# Patient Record
Sex: Male | Born: 1960 | Race: White | Hispanic: No | Marital: Married | Smoking: Current every day smoker
Health system: Southern US, Community
[De-identification: ages and names within clinical notes are randomized; demographics above are authoritative.]

## PROBLEM LIST (undated history)

## (undated) DIAGNOSIS — J449 Chronic obstructive pulmonary disease, unspecified: Secondary | ICD-10-CM

## (undated) DIAGNOSIS — I10 Essential (primary) hypertension: Secondary | ICD-10-CM

## (undated) HISTORY — DX: Chronic obstructive pulmonary disease, unspecified: J44.9

## (undated) HISTORY — PX: KNEE SURGERY: SHX244

---

## 1999-06-19 ENCOUNTER — Encounter: Payer: Self-pay | Admitting: Orthopedic Surgery

## 1999-06-19 ENCOUNTER — Ambulatory Visit (HOSPITAL_COMMUNITY): Admission: RE | Admit: 1999-06-19 | Discharge: 1999-06-19 | Payer: Self-pay | Admitting: Orthopedic Surgery

## 1999-08-28 ENCOUNTER — Emergency Department (HOSPITAL_COMMUNITY): Admission: EM | Admit: 1999-08-28 | Discharge: 1999-08-28 | Payer: Self-pay | Admitting: Emergency Medicine

## 1999-08-28 ENCOUNTER — Encounter: Payer: Self-pay | Admitting: Emergency Medicine

## 2004-05-02 ENCOUNTER — Ambulatory Visit (HOSPITAL_COMMUNITY): Admission: RE | Admit: 2004-05-02 | Discharge: 2004-05-02 | Payer: Self-pay | Admitting: Emergency Medicine

## 2006-07-07 ENCOUNTER — Ambulatory Visit: Payer: Self-pay | Admitting: Family Medicine

## 2010-11-05 ENCOUNTER — Ambulatory Visit
Admission: RE | Admit: 2010-11-05 | Discharge: 2010-11-05 | Disposition: A | Discharge status: Home / Self Care | Payer: BC Managed Care – PPO | Source: Ambulatory Visit | Attending: Orthopedic Surgery | Admitting: Orthopedic Surgery

## 2010-11-05 ENCOUNTER — Other Ambulatory Visit: Payer: Self-pay | Admitting: Orthopedic Surgery

## 2010-11-05 DIAGNOSIS — R52 Pain, unspecified: Secondary | ICD-10-CM

## 2011-10-28 ENCOUNTER — Ambulatory Visit (INDEPENDENT_AMBULATORY_CARE_PROVIDER_SITE_OTHER): Payer: BC Managed Care – PPO | Admitting: Family Medicine

## 2011-10-28 DIAGNOSIS — M109 Gout, unspecified: Secondary | ICD-10-CM

## 2011-10-28 DIAGNOSIS — M79609 Pain in unspecified limb: Secondary | ICD-10-CM

## 2011-10-28 DIAGNOSIS — M79672 Pain in left foot: Secondary | ICD-10-CM

## 2011-10-28 DIAGNOSIS — F1010 Alcohol abuse, uncomplicated: Secondary | ICD-10-CM

## 2011-10-28 MED ORDER — METHYLPREDNISOLONE ACETATE 40 MG/ML IJ SUSP
80.0000 mg | Freq: Once | INTRAMUSCULAR | Status: AC
  Administered 2011-10-28: 80 mg via INTRAMUSCULAR

## 2011-10-28 MED ORDER — COLCHICINE 0.6 MG PO TABS: ORAL_TABLET | ORAL | Status: DC

## 2011-10-28 MED ORDER — ALPRAZOLAM 0.5 MG PO TABS: ORAL_TABLET | ORAL | Status: DC

## 2011-10-28 MED ORDER — HYDROCODONE-ACETAMINOPHEN 10-325 MG PO TABS: 1.0000 | ORAL_TABLET | Freq: Three times a day (TID) | ORAL | Status: AC | PRN

## 2011-10-28 NOTE — Progress Notes (Signed)
  Subjective:    Patient ID: Blake Foley, male    DOB: 17-Aug-1961, 51 y.o.   MRN: [id number]  HPI 51 yo male with h/o gout with left foot/ankle pain for 4 days.  Ibuprofen no help.  Took wife's vicodin no help either.  Last flare was July 2012.  Colcrys helped briefly but pain returned.  Pred helped.  Tapered alcohol significantly but recent increase again over last month.  Back to 12pack beer daily.  In July was given xanax to help him decrease EtOH intake and worked well.  Interested in trying this again.    Review of Systems Negative except as per HPI     Objective:   Physical Exam Obese, no acute distress Left ankle mildly erythematous.  Very TTP.  Left 1st MTP joint also erythematous and very TTP Decrease ROM secondary to pain and swelling.        Assessment & Plan:  Foot pain Gout  Depomedrol IM here.  Colcrys to pharm.  Short term vicodin for until steroid kicks in.   Alcohol abuse Xanax as prescribed.

## 2011-11-23 ENCOUNTER — Telehealth: Payer: Self-pay

## 2011-11-23 NOTE — Telephone Encounter (Signed)
Pt wife calling in pt needs a rx refill on his pain medication he is having another battle with gout. [facility] [address]

## 2011-11-25 NOTE — Telephone Encounter (Signed)
Wife CB and LM on VM asking for RF of pt's hydrocodone and [facility]

## 2011-11-25 NOTE — Telephone Encounter (Signed)
Last OV states pt did not get any relief with colcrys and vicoden.  RTC

## 2011-11-26 NOTE — Telephone Encounter (Signed)
Explained to wife that provider did not want to Rx meds that had really not helped the pt. Advised pt to RTC for eval and to see if provider can Rx something else that might help him more, or prevent gout flare-ups. Wife stated pt is working long hours this week but she will let him know and he will try to get in

## 2011-12-23 ENCOUNTER — Ambulatory Visit (INDEPENDENT_AMBULATORY_CARE_PROVIDER_SITE_OTHER): Payer: BC Managed Care – PPO | Admitting: Internal Medicine

## 2011-12-23 VITALS — BP 169/92 | HR 93 | Temp 99.0°F | Resp 20 | Ht 70.0 in | Wt 222.0 lb

## 2011-12-23 DIAGNOSIS — K047 Periapical abscess without sinus: Secondary | ICD-10-CM

## 2011-12-23 MED ORDER — AMOXICILLIN 500 MG PO CAPS: 1000.0000 mg | ORAL_CAPSULE | Freq: Two times a day (BID) | ORAL | Status: AC

## 2011-12-23 MED ORDER — HYDROCODONE-ACETAMINOPHEN 7.5-500 MG PO TABS
1.0000 | ORAL_TABLET | Freq: Four times a day (QID) | ORAL | Status: AC | PRN
Start: 2011-12-23 — End: 2012-01-02

## 2011-12-23 NOTE — Progress Notes (Signed)
  Subjective:    Patient ID: Blake Foley, male    DOB: [birth year], 51 y.o.   MRN: [id number]  HPI In great pain with swelling in his right upper molar area or incisor area for 3 days No fever/known to have dental problems  Review of Systems     Objective:   Physical Exam There is a large abscess at the root of the right upper incisor which opened by scraping with a tongue blade to allow pus to drain This area is very tender       Assessment & Plan:  Dental abscess  Amoxicillin 1 g twice a day for 10 days Vicodin 7.5/500 4 times a day #30 Follow up with dentist

## 2011-12-24 ENCOUNTER — Other Ambulatory Visit: Payer: Self-pay | Admitting: Family Medicine

## 2011-12-31 ENCOUNTER — Other Ambulatory Visit: Payer: Self-pay

## 2011-12-31 MED ORDER — ALPRAZOLAM 0.5 MG PO TABS: ORAL_TABLET | ORAL | Status: DC

## 2012-04-08 ENCOUNTER — Telehealth: Payer: Self-pay

## 2012-04-08 NOTE — Telephone Encounter (Signed)
Pt is in extreme pain and would like to see if dr can do a refill colcrys [phone] [facility] [address]

## 2012-04-09 ENCOUNTER — Ambulatory Visit (INDEPENDENT_AMBULATORY_CARE_PROVIDER_SITE_OTHER): Payer: BC Managed Care – PPO | Admitting: Emergency Medicine

## 2012-04-09 VITALS — BP 150/70 | HR 128 | Temp 100.6°F | Resp 20

## 2012-04-09 DIAGNOSIS — R509 Fever, unspecified: Secondary | ICD-10-CM

## 2012-04-09 DIAGNOSIS — L0291 Cutaneous abscess, unspecified: Secondary | ICD-10-CM

## 2012-04-09 DIAGNOSIS — M109 Gout, unspecified: Secondary | ICD-10-CM

## 2012-04-09 DIAGNOSIS — L0390 Cellulitis, unspecified: Secondary | ICD-10-CM

## 2012-04-09 LAB — POCT URINALYSIS DIPSTICK
Ketones, UA: 40
Protein, UA: 300
Spec Grav, UA: 1.025
Urobilinogen, UA: 2
pH, UA: 6

## 2012-04-09 LAB — POCT CBC
Granulocyte percent: 81.4 %G — AB (ref 37–80)
MCV: 97.9 fL — AB (ref 80–97)
MID (cbc): 1.3 — AB (ref 0–0.9)
MPV: 7.7 fL (ref 0–99.8)
POC Granulocyte: 11.4 — AB (ref 2–6.9)
POC LYMPH PERCENT: 9.6 %L — AB (ref 10–50)
POC MID %: 9 %M (ref 0–12)
Platelet Count, POC: 287 10*3/uL (ref 142–424)
RBC: 4.89 M/uL (ref 4.69–6.13)
RDW, POC: 12.8 %

## 2012-04-09 LAB — POCT UA - MICROSCOPIC ONLY
Casts, Ur, LPF, POC: NEGATIVE
Crystals, Ur, HPF, POC: NEGATIVE

## 2012-04-09 MED ORDER — DOXYCYCLINE HYCLATE 100 MG PO TABS: 100.0000 mg | ORAL_TABLET | Freq: Two times a day (BID) | ORAL | Status: AC

## 2012-04-09 MED ORDER — OXYCODONE HCL 5 MG PO TABS: 5.0000 mg | ORAL_TABLET | ORAL | Status: AC | PRN

## 2012-04-09 MED ORDER — COLCHICINE 0.6 MG PO TABS: 0.6000 mg | ORAL_TABLET | Freq: Three times a day (TID) | ORAL | Status: DC

## 2012-04-09 NOTE — Progress Notes (Signed)
  Subjective:    Patient ID: Blake Foley, male    DOB: 1961-03-16, 51 y.o.   MRN: [id number]  HPI patient first began noticing problems on Monday with pain in his knees and ankles. He now has significant difficulty walking . He has significant swelling in his knees and ankles. He has a history of gout and does not drink alcoholic beverages and has not been very good recently about his diet. He initially responds well to colcrys    Review of Systems     Objective:   Physical Exam patient is a [place] complected male. His chest was clear to auscultation. His cardiac exam reveals a rapid regular rhythm. His abdomen was soft without tenderness. He has significant swelling of both knees and both ankles as well as the MTP joints of both feet.  Results for orders placed in visit on 04/09/12  POCT CBC      Component Value Range   WBC 14.0 (*) 4.6 - 10.2 K/uL   Lymph, poc 1.3  0.6 - 3.4   POC LYMPH PERCENT 9.6 (*) 10 - 50 %L   MID (cbc) 1.3 (*) 0 - 0.9   POC MID % 9.0  0 - 12 %M   POC Granulocyte 11.4 (*) 2 - 6.9   Granulocyte percent 81.4 (*) 37 - 80 %G   RBC 4.89  4.69 - 6.13 M/uL   Hemoglobin 15.7  14.1 - 18.1 g/dL   HCT, POC 86.5  78.4 - 53.7 %   MCV 97.9 (*) 80 - 97 fL   MCH, POC 32.1 (*) 27 - 31.2 pg   MCHC 32.8  31.8 - 35.4 g/dL   RDW, POC 69.6     Platelet Count, POC 287.0  142 - 424 K/uL   MPV 7.7  0 - 99.8 fL        Assessment & Plan:  I suspect gout flares related to EtOH. We discussed this at length. We will give a trial of colcrys  and oxycodone for pain recheck in 48 hours I suspect the fever and white count are is all secondary to gout but will cover with doxycycline.

## 2012-04-09 NOTE — Telephone Encounter (Signed)
Called pt and he states he is pulling into our office now to be seen for his pain.

## 2012-04-10 LAB — COMPREHENSIVE METABOLIC PANEL
ALT: 28 U/L (ref 0–53)
AST: 19 U/L (ref 0–37)
Albumin: 3.9 g/dL (ref 3.5–5.2)
Alkaline Phosphatase: 86 U/L (ref 39–117)
BUN: 7 mg/dL (ref 6–23)
Calcium: 9.3 mg/dL (ref 8.4–10.5)
Chloride: 90 mEq/L — ABNORMAL LOW (ref 96–112)
Potassium: 4.4 mEq/L (ref 3.5–5.3)

## 2012-04-14 ENCOUNTER — Telehealth: Payer: Self-pay

## 2012-04-14 NOTE — Telephone Encounter (Signed)
Pt states he could not come back in on Sunday due to swollen legs and too much pain.  He is requesting out of work note to return tomorrow.   Also, wants a stronger pain medication prefers vicodin  [facility] [address] [phone]

## 2012-04-14 NOTE — Telephone Encounter (Signed)
Patient states he is almost out of his Oxycodone 5mg , requests renewal or Vicodin, I told him if he is not better he should return to clinic. He states he is better and wants to return to work tomorrow. He indicates wants the meds to take when he gets home from work. Please advise. 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 Please advise if meds can be renewed I will call him. I spoke to Maralyn Sago about this and he should come back in prior to renewal. I called him to advise he will need office visit prior to renewal but I can leave him a note to return to work.

## 2012-04-16 ENCOUNTER — Encounter: Payer: Self-pay | Admitting: Radiology

## 2012-04-18 ENCOUNTER — Telehealth: Payer: Self-pay

## 2012-04-18 MED ORDER — COLCHICINE 0.6 MG PO TABS: 0.6000 mg | ORAL_TABLET | Freq: Three times a day (TID) | ORAL | Status: DC

## 2012-04-18 NOTE — Telephone Encounter (Signed)
Rx sent to pharmacy   

## 2012-04-18 NOTE — Telephone Encounter (Signed)
Spoke with pt advised RX sent in. 

## 2012-04-18 NOTE — Telephone Encounter (Signed)
Patient would like refill auth for Colcrys (for gout), uses [facility] [address] Patient phone #[phone].

## 2012-08-28 ENCOUNTER — Other Ambulatory Visit: Payer: Self-pay | Admitting: Physician Assistant

## 2012-09-29 ENCOUNTER — Ambulatory Visit: Payer: BC Managed Care – PPO

## 2012-09-29 ENCOUNTER — Ambulatory Visit (INDEPENDENT_AMBULATORY_CARE_PROVIDER_SITE_OTHER): Payer: BC Managed Care – PPO | Admitting: Internal Medicine

## 2012-09-29 VITALS — BP 138/80 | HR 99 | Temp 99.1°F | Resp 18 | Ht 70.0 in | Wt 204.2 lb

## 2012-09-29 DIAGNOSIS — F17200 Nicotine dependence, unspecified, uncomplicated: Secondary | ICD-10-CM

## 2012-09-29 DIAGNOSIS — R05 Cough: Secondary | ICD-10-CM

## 2012-09-29 DIAGNOSIS — J9801 Acute bronchospasm: Secondary | ICD-10-CM

## 2012-09-29 DIAGNOSIS — R059 Cough, unspecified: Secondary | ICD-10-CM

## 2012-09-29 DIAGNOSIS — R509 Fever, unspecified: Secondary | ICD-10-CM

## 2012-09-29 DIAGNOSIS — R0602 Shortness of breath: Secondary | ICD-10-CM

## 2012-09-29 LAB — POCT CBC
Granulocyte percent: 63.6 %G (ref 37–80)
HCT, POC: 52 % (ref 43.5–53.7)
Hemoglobin: 16.8 g/dL (ref 14.1–18.1)
MCV: 97.4 fL — AB (ref 80–97)
POC LYMPH PERCENT: 22.3 %L (ref 10–50)
RBC: 5.34 M/uL (ref 4.69–6.13)

## 2012-09-29 MED ORDER — ALBUTEROL SULFATE (2.5 MG/3ML) 0.083% IN NEBU: 2.5000 mg | INHALATION_SOLUTION | Freq: Once | RESPIRATORY_TRACT | Status: DC

## 2012-09-29 MED ORDER — HYDROCODONE-ACETAMINOPHEN 7.5-325 MG/15ML PO SOLN: 5.0000 mL | Freq: Four times a day (QID) | ORAL | Status: DC | PRN

## 2012-09-29 MED ORDER — AZITHROMYCIN 500 MG PO TABS: 500.0000 mg | ORAL_TABLET | Freq: Every day | ORAL | Status: DC

## 2012-09-29 MED ORDER — METHYLPREDNISOLONE ACETATE 80 MG/ML IJ SUSP
120.0000 mg | Freq: Once | INTRAMUSCULAR | Status: AC
  Administered 2012-09-29: 120 mg via INTRAMUSCULAR

## 2012-09-29 MED ORDER — ALBUTEROL SULFATE HFA 108 (90 BASE) MCG/ACT IN AERS: 2.0000 | INHALATION_SPRAY | Freq: Four times a day (QID) | RESPIRATORY_TRACT | Status: DC | PRN

## 2012-09-29 MED ORDER — IPRATROPIUM BROMIDE 0.02 % IN SOLN: 0.5000 mg | Freq: Once | RESPIRATORY_TRACT | Status: DC

## 2012-09-29 NOTE — Patient Instructions (Signed)
Chronic Obstructive Pulmonary Disease Chronic obstructive pulmonary disease (COPD) is a condition in which airflow from the lungs is restricted. The lungs can never return to normal, but there are measures you can take which will improve them and make you feel better. CAUSES   Smoking.  Exposure to secondhand smoke.  Breathing in irritants (pollution, cigarette smoke, strong smells, aerosol sprays, paint fumes).  History of lung infections. TREATMENT  Treatment focuses on making you comfortable (supportive care). Your caregiver may prescribe medications (inhaled or pills) to help improve your breathing. HOME CARE INSTRUCTIONS   If you smoke, stop smoking.  Avoid exposure to smoke, chemicals, and fumes that aggravate your breathing.  Take antibiotic medicines as directed by your caregiver.  Avoid medicines that dry up your system and slow down the elimination of secretions (antihistamines and cough syrups). This decreases respiratory capacity and may lead to infections.  Drink enough water and fluids to keep your urine clear or pale yellow. This loosens secretions.  Use humidifiers at home and at your bedside if they do not make breathing difficult.  Receive all protective vaccines your caregiver suggests, especially pneumococcal and influenza.  Use home oxygen as suggested.  Stay active. Exercise and physical activity will help maintain your ability to do things you want to do.  Eat a healthy diet. SEEK MEDICAL CARE IF:   You develop pus-like mucus (sputum).  Breathing is more labored or exercise becomes difficult to do.  You are running out of the medicine you take for your breathing. SEEK IMMEDIATE MEDICAL CARE IF:   You have a rapid heart rate.  You have agitation, confusion, tremors, or are in a stupor (family members may need to observe this).  It becomes difficult to breathe.  You develop chest pain.  You have a fever. MAKE SURE YOU:   Understand these  instructions.  Will watch your condition.  Will get help right away if you are not doing well or get worse. Document Released: 06/12/2005 Document Revised: 11/25/2011 Document Reviewed: 11/02/2010 [facility] Patient Information 2013 [place], [place]. Smoking Cessation Quitting smoking is important to your health and has many advantages. However, it is not always easy to quit since nicotine is a very addictive drug. Often times, people try 3 times or more before being able to quit. This document explains the best ways for you to prepare to quit smoking. Quitting takes hard work and a lot of effort, but you can do it. ADVANTAGES OF QUITTING SMOKING  You will live longer, feel better, and live better.  Your body will feel the impact of quitting smoking almost immediately.  Within 20 minutes, blood pressure decreases. Your pulse returns to its normal level.  After 8 hours, carbon monoxide levels in the blood return to normal. Your oxygen level increases.  After 24 hours, the chance of having a heart attack starts to decrease. Your breath, hair, and body stop smelling like smoke.  After 48 hours, damaged nerve endings begin to recover. Your sense of taste and smell improve.  After 72 hours, the body is virtually free of nicotine. Your bronchial tubes relax and breathing becomes easier.  After 2 to 12 weeks, lungs can hold more air. Exercise becomes easier and circulation improves.  The risk of having a heart attack, stroke, cancer, or lung disease is greatly reduced.  After 1 year, the risk of coronary heart disease is cut in half.  After 5 years, the risk of stroke falls to the same as a nonsmoker.  After 10 years, the risk of lung cancer is cut in half and the risk of other cancers decreases significantly.  After 15 years, the risk of coronary heart disease drops, usually to the level of a nonsmoker.  If you are pregnant, quitting smoking will improve your chances of having a healthy  baby.  The people you live with, especially any children, will be healthier.  You will have extra money to spend on things other than cigarettes. QUESTIONS TO THINK ABOUT BEFORE ATTEMPTING TO QUIT You may want to talk about your answers with your caregiver.  Why do you want to quit?  If you tried to quit in the past, what helped and what did not?  What will be the most difficult situations for you after you quit? How will you plan to handle them?  Who can help you through the tough times? Your family? Friends? A caregiver?  What pleasures do you get from smoking? What ways can you still get pleasure if you quit? Here are some questions to ask your caregiver:  How can you help me to be successful at quitting?  What medicine do you think would be best for me and how should I take it?  What should I do if I need more help?  What is smoking withdrawal like? How can I get information on withdrawal? GET READY  Set a quit date.  Change your environment by getting rid of all cigarettes, ashtrays, matches, and lighters in your home, car, or work. Do not let people smoke in your home.  Review your past attempts to quit. Think about what worked and what did not. GET SUPPORT AND ENCOURAGEMENT You have a better chance of being successful if you have help. You can get support in many ways.  Tell your family, friends, and co-workers that you are going to quit and need their support. Ask them not to smoke around you.  Get individual, group, or telephone counseling and support. Programs are available at [employer] and health centers. Call your local health department for information about programs in your area.  Spiritual beliefs and practices may help some smokers quit.  Download a "quit meter" on your computer to keep track of quit statistics, such as how long you have gone without smoking, cigarettes not smoked, and money saved.  Get a self-help book about quitting smoking and  staying off of tobacco. LEARN NEW SKILLS AND BEHAVIORS  Distract yourself from urges to smoke. Talk to someone, go for a walk, or occupy your time with a task.  Change your normal routine. Take a different route to work. Drink tea instead of coffee. Eat breakfast in a different place.  Reduce your stress. Take a hot bath, exercise, or read a book.  Plan something enjoyable to do every day. Reward yourself for not smoking.  Explore interactive web-based programs that specialize in helping you quit. GET MEDICINE AND USE IT CORRECTLY Medicines can help you stop smoking and decrease the urge to smoke. Combining medicine with the above behavioral methods and support can greatly increase your chances of successfully quitting smoking.  Nicotine replacement therapy helps deliver nicotine to your body without the negative effects and risks of smoking. Nicotine replacement therapy includes nicotine gum, lozenges, inhalers, nasal sprays, and skin patches. Some may be available over-the-counter and others require a prescription.  Antidepressant medicine helps people abstain from smoking, but how this works is unknown. This medicine is available by prescription.  Nicotinic receptor partial agonist medicine  simulates the effect of nicotine in your brain. This medicine is available by prescription. Ask your caregiver for advice about which medicines to use and how to use them based on your health history. Your caregiver will tell you what side effects to look out for if you choose to be on a medicine or therapy. Carefully read the information on the package. Do not use any other product containing nicotine while using a nicotine replacement product.  RELAPSE OR DIFFICULT SITUATIONS Most relapses occur within the first 3 months after quitting. Do not be discouraged if you start smoking again. Remember, most people try several times before finally quitting. You may have symptoms of withdrawal because your body  is used to nicotine. You may crave cigarettes, be irritable, feel very hungry, cough often, get headaches, or have difficulty concentrating. The withdrawal symptoms are only temporary. They are strongest when you first quit, but they will go away within 10 14 days. To reduce the chances of relapse, try to:  Avoid drinking alcohol. Drinking lowers your chances of successfully quitting.  Reduce the amount of caffeine you consume. Once you quit smoking, the amount of caffeine in your body increases and can give you symptoms, such as a rapid heartbeat, sweating, and anxiety.  Avoid smokers because they can make you want to smoke.  Do not let weight gain distract you. Many smokers will gain weight when they quit, usually less than 10 pounds. Eat a healthy diet and stay active. You can always lose the weight gained after you quit.  Find ways to improve your mood other than smoking. FOR MORE INFORMATION  www.smokefree.gov  Document Released: 08/27/2001 Document Revised: 03/03/2012 Document Reviewed: 12/12/2011 [facility] Patient Information 2013 [place], [place].

## 2012-09-29 NOTE — Progress Notes (Signed)
  Subjective:    Patient ID: Blake Foley, male    DOB: Jan 03, 1961, 52 y.o.   MRN: [id number]  HPI Smoker for years Has cough,fever,wheezes, sob. No hemoptysis.   Review of Systems     Objective:   Physical Exam  Constitutional: He is oriented to person, place, and time. He appears well-developed and well-nourished. No distress.  HENT:  Right Ear: External ear normal.  Left Ear: External ear normal.  Nose: Mucosal edema, rhinorrhea and sinus tenderness present.  Mouth/Throat: Oropharyngeal exudate present.  Neck: Neck supple.  Cardiovascular: Normal rate, regular rhythm and normal heart sounds.   Pulmonary/Chest: No accessory muscle usage. Not tachypneic. No respiratory distress. He has decreased breath sounds. He has wheezes. He has rhonchi. He has no rales.  Lymphadenopathy:    He has no cervical adenopathy.  Neurological: He is alert and oriented to person, place, and time. Coordination normal.  Psychiatric: He has a normal mood and affect.   Lungs decreased bs all over, wheezes,rhonchi  [facility] reading (PRIMARY) by  Dr.Jabrea Kallstrom scarring,fibrosis,hyperinflation Results for orders placed in visit on 09/29/12  POCT CBC      Component Value Range   WBC 7.6  4.6 - 10.2 K/uL   Lymph, poc 1.7  0.6 - 3.4   POC LYMPH PERCENT 22.3  10 - 50 %L   MID (cbc) 1.1 (*) 0 - 0.9   POC MID % 14.1 (*) 0 - 12 %M   POC Granulocyte 4.8  2 - 6.9   Granulocyte percent 63.6  37 - 80 %G   RBC 5.34  4.69 - 6.13 M/uL   Hemoglobin 16.8  14.1 - 18.1 g/dL   HCT, POC 29.5  62.1 - 53.7 %   MCV 97.4 (*) 80 - 97 fL   MCH, POC 31.5 (*) 27 - 31.2 pg   MCHC 32.3  31.8 - 35.4 g/dL   RDW, POC 30.8     Platelet Count, POC 188  142 - 424 K/uL   MPV 8.3  0 - 99.8 fL         Assessment & Plan:  COPD/Bronchospasm/Chest infection Quit smoking

## 2012-10-03 ENCOUNTER — Ambulatory Visit (INDEPENDENT_AMBULATORY_CARE_PROVIDER_SITE_OTHER): Payer: BC Managed Care – PPO | Admitting: Internal Medicine

## 2012-10-03 ENCOUNTER — Telehealth: Payer: Self-pay

## 2012-10-03 ENCOUNTER — Encounter: Payer: Self-pay | Admitting: Internal Medicine

## 2012-10-03 VITALS — BP 140/70 | HR 85 | Temp 97.9°F | Resp 16 | Ht 71.0 in | Wt 204.4 lb

## 2012-10-03 DIAGNOSIS — J9801 Acute bronchospasm: Secondary | ICD-10-CM

## 2012-10-03 DIAGNOSIS — R222 Localized swelling, mass and lump, trunk: Secondary | ICD-10-CM

## 2012-10-03 DIAGNOSIS — R05 Cough: Secondary | ICD-10-CM

## 2012-10-03 DIAGNOSIS — R509 Fever, unspecified: Secondary | ICD-10-CM

## 2012-10-03 DIAGNOSIS — F17200 Nicotine dependence, unspecified, uncomplicated: Secondary | ICD-10-CM

## 2012-10-03 DIAGNOSIS — R0602 Shortness of breath: Secondary | ICD-10-CM

## 2012-10-03 DIAGNOSIS — R059 Cough, unspecified: Secondary | ICD-10-CM

## 2012-10-03 MED ORDER — HYDROCODONE-ACETAMINOPHEN 7.5-325 MG/15ML PO SOLN: 5.0000 mL | Freq: Four times a day (QID) | ORAL | Status: DC | PRN

## 2012-10-03 MED ORDER — ALPRAZOLAM 1 MG PO TABS: ORAL_TABLET | ORAL | Status: DC

## 2012-10-03 NOTE — Patient Instructions (Signed)

## 2012-10-03 NOTE — Telephone Encounter (Signed)
[facility] if [facility]. Pt got it filled on 1/14, but dropped bottle and needs some more

## 2012-10-03 NOTE — Progress Notes (Signed)
  Subjective:    Patient ID: Blake Foley, male    DOB: 10-05-1960, 52 y.o.   MRN: [id number]  HPI Has an abnormal cxr and wants more details.Is quiting smoking and drinking and wants medication to help. Counseled in detail. Needs ct chest to evaluate right lung 1 cm density. No hemoptysis. Feels better with tx of the lung infection/ bronchospasm persists and albuterol helps.   Review of Systems     Objective:   Physical Exam        Assessment & Plan:  CT chest Quit smoking -- will use electronic cigarette/alprazolam Quit ETOH plan made Schedule CPE and pulmonary eval

## 2012-10-06 ENCOUNTER — Telehealth: Payer: Self-pay | Admitting: Radiology

## 2012-10-06 NOTE — Telephone Encounter (Signed)
Patient states he spilled his cough syrup and would like another rx called in for him, please advise.

## 2012-10-06 NOTE — Telephone Encounter (Signed)
Call pharmacy okay to fill rx from 10/03/12

## 2012-10-06 NOTE — Telephone Encounter (Signed)
Patients pharmacy advi

## 2012-10-21 ENCOUNTER — Ambulatory Visit
Admission: RE | Admit: 2012-10-21 | Discharge: 2012-10-21 | Disposition: A | Discharge status: Home / Self Care | Payer: BC Managed Care – PPO | Source: Ambulatory Visit | Attending: Internal Medicine | Admitting: Internal Medicine

## 2012-10-21 DIAGNOSIS — R222 Localized swelling, mass and lump, trunk: Secondary | ICD-10-CM

## 2012-10-21 MED ORDER — IOHEXOL 300 MG/ML  SOLN: 75.0000 mL | Freq: Once | INTRAMUSCULAR | Status: AC | PRN

## 2012-11-05 ENCOUNTER — Other Ambulatory Visit: Payer: Self-pay | Admitting: Internal Medicine

## 2012-11-28 ENCOUNTER — Other Ambulatory Visit: Payer: Self-pay | Admitting: Internal Medicine

## 2012-12-23 ENCOUNTER — Other Ambulatory Visit: Payer: Self-pay | Admitting: Emergency Medicine

## 2012-12-24 ENCOUNTER — Other Ambulatory Visit: Payer: Self-pay | Admitting: Physician Assistant

## 2012-12-25 NOTE — Telephone Encounter (Signed)
RTC

## 2013-01-21 ENCOUNTER — Other Ambulatory Visit: Payer: Self-pay | Admitting: Internal Medicine

## 2013-01-22 NOTE — Telephone Encounter (Signed)
rtc 

## 2013-01-27 ENCOUNTER — Other Ambulatory Visit: Payer: Self-pay | Admitting: Internal Medicine

## 2013-01-30 ENCOUNTER — Other Ambulatory Visit: Payer: Self-pay | Admitting: Internal Medicine

## 2013-02-02 ENCOUNTER — Telehealth: Payer: Self-pay

## 2013-02-02 NOTE — Telephone Encounter (Signed)
Pharm requests RF of Alprazolam 1 mg.

## 2013-02-02 NOTE — Telephone Encounter (Signed)
If wants alprazolam needs to rtc.

## 2013-02-08 ENCOUNTER — Telehealth: Payer: Self-pay

## 2013-02-08 NOTE — Telephone Encounter (Signed)
Pharm requests RF of alprazolam 1 mg. 

## 2013-02-10 ENCOUNTER — Telehealth: Payer: Self-pay

## 2013-02-10 NOTE — Telephone Encounter (Signed)
Pharm requests RF of alprazolam 1 mg. 

## 2013-02-11 ENCOUNTER — Other Ambulatory Visit: Payer: Self-pay | Admitting: Physician Assistant

## 2013-02-16 ENCOUNTER — Other Ambulatory Visit: Payer: Self-pay | Admitting: Radiology

## 2013-02-16 DIAGNOSIS — R0602 Shortness of breath: Secondary | ICD-10-CM

## 2013-02-16 DIAGNOSIS — R222 Localized swelling, mass and lump, trunk: Secondary | ICD-10-CM

## 2013-02-16 DIAGNOSIS — J9801 Acute bronchospasm: Secondary | ICD-10-CM

## 2013-02-16 DIAGNOSIS — R509 Fever, unspecified: Secondary | ICD-10-CM

## 2013-02-16 DIAGNOSIS — F17200 Nicotine dependence, unspecified, uncomplicated: Secondary | ICD-10-CM

## 2013-02-16 DIAGNOSIS — R059 Cough, unspecified: Secondary | ICD-10-CM

## 2013-02-16 DIAGNOSIS — R05 Cough: Secondary | ICD-10-CM

## 2013-02-16 NOTE — Telephone Encounter (Signed)
Please advise on refill of Alprazolam pended

## 2013-02-19 NOTE — Telephone Encounter (Signed)
rtc 

## 2013-02-28 ENCOUNTER — Ambulatory Visit: Payer: BC Managed Care – PPO

## 2013-02-28 ENCOUNTER — Ambulatory Visit (INDEPENDENT_AMBULATORY_CARE_PROVIDER_SITE_OTHER): Payer: BC Managed Care – PPO | Admitting: Emergency Medicine

## 2013-02-28 VITALS — BP 170/88 | HR 89 | Temp 97.7°F | Resp 16 | Ht 70.18 in | Wt 211.6 lb

## 2013-02-28 DIAGNOSIS — M25539 Pain in unspecified wrist: Secondary | ICD-10-CM

## 2013-02-28 DIAGNOSIS — M25531 Pain in right wrist: Secondary | ICD-10-CM

## 2013-02-28 DIAGNOSIS — F17200 Nicotine dependence, unspecified, uncomplicated: Secondary | ICD-10-CM

## 2013-02-28 DIAGNOSIS — M109 Gout, unspecified: Secondary | ICD-10-CM

## 2013-02-28 DIAGNOSIS — R059 Cough, unspecified: Secondary | ICD-10-CM

## 2013-02-28 DIAGNOSIS — R05 Cough: Secondary | ICD-10-CM

## 2013-02-28 DIAGNOSIS — I10 Essential (primary) hypertension: Secondary | ICD-10-CM

## 2013-02-28 MED ORDER — ALPRAZOLAM 1 MG PO TABS: ORAL_TABLET | ORAL | Status: DC

## 2013-02-28 MED ORDER — HYDROCODONE-ACETAMINOPHEN 5-325 MG PO TABS: 1.0000 | ORAL_TABLET | ORAL | Status: DC | PRN

## 2013-02-28 MED ORDER — LISINOPRIL 10 MG PO TABS: 10.0000 mg | ORAL_TABLET | Freq: Every day | ORAL | Status: DC

## 2013-02-28 MED ORDER — INDOMETHACIN 25 MG PO CAPS: 25.0000 mg | ORAL_CAPSULE | Freq: Three times a day (TID) | ORAL | Status: DC

## 2013-02-28 NOTE — Patient Instructions (Addendum)
gGout Gout is an inflammatory condition (arthritis) caused by a buildup of uric acid crystals in the joints. Uric acid is a chemical that is normally present in the blood. Under some circumstances, uric acid can form into crystals in your joints. This causes joint redness, soreness, and swelling (inflammation). Repeat attacks are common. Over time, uric acid crystals can form into masses (tophi) near a joint, causing disfigurement. Gout is treatable and often preventable. CAUSES  The disease begins with elevated levels of uric acid in the blood. Uric acid is produced by your body when it breaks down a naturally found substance called purines. This also happens when you eat certain foods such as meats and fish. Causes of an elevated uric acid level include:  Being passed down from parent to child (heredity).  Diseases that cause increased uric acid production (obesity, psoriasis, some cancers).  Excessive alcohol use.  Diet, especially diets rich in meat and seafood.  Medicines, including certain cancer-fighting drugs (chemotherapy), diuretics, and aspirin.  Chronic kidney disease. The kidneys are no longer able to remove uric acid well.  Problems with metabolism. Conditions strongly associated with gout include:  Obesity.  High blood pressure.  High cholesterol.  Diabetes. Not everyone with elevated uric acid levels gets gout. It is not understood why some people get gout and others do not. Surgery, joint injury, and eating too much of certain foods are some of the factors that can lead to gout. SYMPTOMS   An attack of gout comes on quickly. It causes intense pain with redness, swelling, and warmth in a joint.  Fever can occur.  Often, only one joint is involved. Certain joints are more commonly involved:  Base of the big toe.  Knee.  Ankle.  Wrist.  Finger. Without treatment, an attack usually goes away in a few days to weeks. Between attacks, you usually will not have  symptoms, which is different from many other forms of arthritis. DIAGNOSIS  Your caregiver will suspect gout based on your symptoms and exam. Removal of fluid from the joint (arthrocentesis) is done to check for uric acid crystals. Your caregiver will give you a medicine that numbs the area (local anesthetic) and use a needle to remove joint fluid for exam. Gout is confirmed when uric acid crystals are seen in joint fluid, using a special microscope. Sometimes, blood, urine, and X-ray tests are also used. TREATMENT  There are 2 phases to gout treatment: treating the sudden onset (acute) attack and preventing attacks (prophylaxis). Treatment of an Acute Attack  Medicines are used. These include anti-inflammatory medicines or steroid medicines.  An injection of steroid medicine into the affected joint is sometimes necessary.  The painful joint is rested. Movement can worsen the arthritis.  You may use warm or cold treatments on painful joints, depending which works best for you.  Discuss the use of coffee, vitamin C, or cherries with your caregiver. These may be helpful treatment options. Treatment to Prevent Attacks After the acute attack subsides, your caregiver may advise prophylactic medicine. These medicines either help your kidneys eliminate uric acid from your body or decrease your uric acid production. You may need to stay on these medicines for a very long time. The early phase of treatment with prophylactic medicine can be associated with an increase in acute gout attacks. For this reason, during the first few months of treatment, your caregiver may also advise you to take medicines usually used for acute gout treatment. Be sure you understand your caregiver's directions.  You should also discuss dietary treatment with your caregiver. Certain foods such as meats and fish can increase uric acid levels. Other foods such as dairy can decrease levels. Your caregiver can give you a list of foods  to avoid. HOME CARE INSTRUCTIONS   Do not take aspirin to relieve pain. This raises uric acid levels.  Only take over-the-counter or prescription medicines for pain, discomfort, or fever as directed by your caregiver.  Rest the joint as much as possible. When in bed, keep sheets and blankets off painful areas.  Keep the affected joint raised (elevated).  Use crutches if the painful joint is in your leg.  Drink enough water and fluids to keep your urine clear or pale yellow. This helps your body get rid of uric acid. Do not drink alcoholic beverages. They slow the passage of uric acid.  Follow your caregiver's dietary instructions. Pay careful attention to the amount of protein you eat. Your daily diet should emphasize fruits, vegetables, whole grains, and fat-free or low-fat milk products.  Maintain a healthy body weight. SEEK MEDICAL CARE IF:   You have an oral temperature above 102 F (38.9 C).  You develop diarrhea, vomiting, or any side effects from medicines.  You do not feel better in 24 hours, or you are getting worse. SEEK IMMEDIATE MEDICAL CARE IF:   Your joint becomes suddenly more tender and you have:  Chills.  An oral temperature above 102 F (38.9 C), not controlled by medicine. MAKE SURE YOU:   Understand these instructions.  Will watch your condition.  Will get help right away if you are not doing well or get worse. Document Released: 08/30/2000 Document Revised: 11/25/2011 Document Reviewed: 12/11/2009 [facility] Patient Information 2014 [place], [place]. Hypertension As your heart beats, it forces blood through your arteries. This force is your blood pressure. If the pressure is too high, it is called hypertension (HTN) or high blood pressure. HTN is dangerous because you may have it and not know it. High blood pressure may mean that your heart has to work harder to pump blood. Your arteries may be narrow or stiff. The extra work puts you at risk for heart  disease, stroke, and other problems.  Blood pressure consists of two numbers, a higher number over a lower, 110/72, for example. It is stated as "110 over 72." The ideal is below 120 for the top number (systolic) and under 80 for the bottom (diastolic). Write down your blood pressure today. You should pay close attention to your blood pressure if you have certain conditions such as:  Heart failure.  Prior heart attack.  Diabetes  Chronic kidney disease.  Prior stroke.  Multiple risk factors for heart disease. To see if you have HTN, your blood pressure should be measured while you are seated with your arm held at the level of the heart. It should be measured at least twice. A one-time elevated blood pressure reading (especially in the Emergency Department) does not mean that you need treatment. There may be conditions in which the blood pressure is different between your right and left arms. It is important to see your caregiver soon for a recheck. Most people have essential hypertension which means that there is not a specific cause. This type of high blood pressure may be lowered by changing lifestyle factors such as:  Stress.  Smoking.  Lack of exercise.  Excessive weight.  Drug/tobacco/alcohol use.  Eating less salt. Most people do not have symptoms from high blood pressure  until it has caused damage to the body. Effective treatment can often prevent, delay or reduce that damage. TREATMENT  When a cause has been identified, treatment for high blood pressure is directed at the cause. There are a large number of medications to treat HTN. These fall into several categories, and your caregiver will help you select the medicines that are best for you. Medications may have side effects. You should review side effects with your caregiver. If your blood pressure stays high after you have made lifestyle changes or started on medicines,   Your medication(s) may need to be  changed.  Other problems may need to be addressed.  Be certain you understand your prescriptions, and know how and when to take your medicine.  Be sure to follow up with your caregiver within the time frame advised (usually within two weeks) to have your blood pressure rechecked and to review your medications.  If you are taking more than one medicine to lower your blood pressure, make sure you know how and at what times they should be taken. Taking two medicines at the same time can result in blood pressure that is too low. SEEK IMMEDIATE MEDICAL CARE IF:  You develop a severe headache, blurred or changing vision, or confusion.  You have unusual weakness or numbness, or a faint feeling.  You have severe chest or abdominal pain, vomiting, or breathing problems. MAKE SURE YOU:   Understand these instructions.  Will watch your condition.  Will get help right away if you are not doing well or get worse. Document Released: 09/02/2005 Document Revised: 11/25/2011 Document Reviewed: 04/22/2008 [facility] Patient Information 2014 [place], [place].

## 2013-02-28 NOTE — Progress Notes (Signed)
[facility] [address], [place] [address] [phone]  Date:  02/28/2013   Name:  Blake Foley   DOB:  1961-01-07   MRN:  [id number]  PCP:  Lucilla Edin, MD    Chief Complaint: Wrist Pain and Knee Pain   History of Present Illness:  Blake Foley is a 52 y.o. very pleasant male patient who presents with the following:  Has run out of his xanax.  Has been drinking beer more than usual.  Has increasing pain in left knee and right wrist with no history of injury or overuse.  Has taken no colcrys.  No fever or chills, no other complaint.  Denies other complaint or health concern today.   Patient Active Problem List   Diagnosis Date Noted  . Gout 10/28/2011    Past Medical History  Diagnosis Date  . COPD (chronic obstructive pulmonary disease)     Past Surgical History  Procedure Laterality Date  . Knee surgery      History  Substance Use Topics  . Smoking status: Current Every Day Smoker -- 0.50 packs/day for 30 years    Types: Cigarettes  . Smokeless tobacco: Not on file  . Alcohol Use: 1.8 oz/week    3 Cans of beer per week    Family History  Problem Relation Age of Onset  . Stroke Father     No Known Allergies  Medication list has been reviewed and updated.  Current Outpatient Prescriptions on File Prior to Visit  Medication Sig Dispense Refill  . albuterol (PROAIR HFA) 108 (90 BASE) MCG/ACT inhaler INHALE 2 PUFFS INTO THE LUNGS EVERY 6 (SIX) HOURS AS NEEDED FOR WHEEZING.  8.5 each  0  . ALPRAZolam (XANAX) 1 MG tablet Take 1/2 to 1 po bid after work prn agitation.Avoid alcohol  60 tablet  3  . Bee Pollen 1000 MG TABS Take by mouth.      . COLCRYS 0.6 MG tablet TAKE 1 TABLET (0.6 MG TOTAL) BY MOUTH 3 (THREE) TIMES DAILY.  30 tablet  1  . COLCRYS 0.6 MG tablet TAKE 1 TABLET (0.6 MG TOTAL) BY MOUTH 3 (THREE) TIMES DAILY.  30 tablet  2  . GARLIC PO Take by mouth.      Marland Kitchen glucosamine-chondroitin 500-400 MG tablet Take 1 tablet by  mouth 3 (three) times daily.      Marland Kitchen ibuprofen (ADVIL,MOTRIN) 200 MG tablet Take 200 mg by mouth every 6 (six) hours as needed.      . Multiple Vitamin (MULTIVITAMIN) capsule Take 1 capsule by mouth daily.       No current facility-administered medications on file prior to visit.    Review of Systems:  As per HPI, otherwise negative.    Physical Examination: Filed Vitals:   02/28/13 1149  BP: 170/88  Pulse: 89  Temp: 97.7 F (36.5 C)  Resp: 16   Filed Vitals:   02/28/13 1149  Height: 5' 10.18" (1.783 m)  Weight: 211 lb 9.6 oz (95.981 kg)   Body mass index is 30.19 kg/(m^2). Ideal Body Weight: Weight in (lb) to have BMI = 25: 174.8   GEN: WDWN, NAD, Non-toxic, Alert & Oriented x 3 HEENT: Atraumatic, Normocephalic.  Ears and Nose: No external deformity. EXTR: No clubbing/cyanosis/edema NEURO: Normal gait.  PSYCH: Normally interactive. Conversant. Not depressed or anxious appearing.  Calm demeanor.  Right wrist:  Marked pain and tenderness in ulnar aspect of wrist.  Moderate effusion.  Guards Left  Knee:  Effusion and red and warm.  Guards and tender.  Assessment and Plan: Gout Indocin Hydrocodone   Signed,  Phillips Odor, MD   [facility] reading (PRIMARY) by  Dr. Dareen Piano.  Arthritis wrist.

## 2013-03-02 ENCOUNTER — Telehealth: Payer: Self-pay

## 2013-03-02 NOTE — Telephone Encounter (Signed)
States the gout flare up is increasing in pain. He has been taking 3-4 to alleviate the pain and is not helping. He has taken Hydro 10mg  in the past and has helped, can we please send in another type of medication to [facility]

## 2013-03-02 NOTE — Telephone Encounter (Signed)
Wife states pain medication is not strong enough.  Patient is having to take to many of them to relieve the pain.   [facility] [address]   [phone]

## 2013-03-02 NOTE — Telephone Encounter (Signed)
Left message to call us back

## 2013-03-02 NOTE — Telephone Encounter (Signed)
Patient is taking 3-4 of what?  He should be taking 25 mg indomethacin every 8 hours with food and 1-2 Norco (which is hydrocodone 5-10mg ) every 4 hours.  We can increase the indomethacin to 50mg  (so #2 pills every 8 hours with food).  If pain is not controlled pt may need to RTC for further eval

## 2013-03-03 NOTE — Telephone Encounter (Signed)
I believe that the patient was told he could take 2 of the hydrocodone that he has to see if that will help his pain.  If the patient is not happy with this Dr Dareen Piano should be sent the message.

## 2013-03-03 NOTE — Telephone Encounter (Signed)
PT CALLING BACK, HE WOULD LIKE A CALL BACK AT [phone]

## 2013-03-03 NOTE — Telephone Encounter (Signed)
Called him and he has taken colchicine and hydrocodone 5mg  tablet  He states there is no relief with this. He is asking for increased dose of the hydrocodone. He states hydrocodone 10mg  helped more, he has about 18 tablets left.

## 2013-03-03 NOTE — Telephone Encounter (Signed)
Called him. Left message for him to call me back.

## 2013-03-04 NOTE — Telephone Encounter (Signed)
Pt advised to take two, if he needs stronger he should return to clinic for recheck.

## 2013-03-26 ENCOUNTER — Other Ambulatory Visit: Payer: Self-pay | Admitting: Internal Medicine

## 2013-03-26 NOTE — Telephone Encounter (Signed)
He should have refills.

## 2013-03-30 ENCOUNTER — Telehealth: Payer: Self-pay | Admitting: Radiology

## 2013-03-30 NOTE — Telephone Encounter (Signed)
Mr Rollyson wants call back about the Alprazolam. He can not get his medication, pharmacy indicates something is wrong. [phone] I called the pharmacy and they can not run the Rx with Dr Linwood Dibbles DEA number, apparently it is expired. Can you write a Rx to get him through?

## 2013-03-30 NOTE — Telephone Encounter (Signed)
Spoke to Dr Dareen Piano, he does not refill Alprazolam and does not plan to refill this for patient. Advised patient per Dr Dareen Piano he needs office cancelled the refills at the pharmacy per Dr Dareen Piano.

## 2013-04-05 ENCOUNTER — Ambulatory Visit (INDEPENDENT_AMBULATORY_CARE_PROVIDER_SITE_OTHER): Payer: BC Managed Care – PPO | Admitting: Emergency Medicine

## 2013-04-05 ENCOUNTER — Other Ambulatory Visit: Payer: Self-pay | Admitting: Emergency Medicine

## 2013-04-05 VITALS — BP 148/92 | HR 84 | Temp 97.7°F | Resp 18 | Ht 70.0 in | Wt 220.6 lb

## 2013-04-05 DIAGNOSIS — M25549 Pain in joints of unspecified hand: Secondary | ICD-10-CM

## 2013-04-05 DIAGNOSIS — M25569 Pain in unspecified knee: Secondary | ICD-10-CM

## 2013-04-05 DIAGNOSIS — M109 Gout, unspecified: Secondary | ICD-10-CM

## 2013-04-05 NOTE — Progress Notes (Signed)
  Subjective:    Patient ID: Blake Foley, male    DOB: 05-08-1961, 52 y.o.   MRN: [id number]  HPI patient with previous history of gout in his with pain and swelling in his hand and both knees.    Review of Systems     Objective:   Physical Exam patient has pain and swelling over the MCP joint of the left index finger. He has degenerative changes of both knees with pain and swelling of both knees.        Assessment & Plan:  History and physical exam are most consistent with gout. He will be treated accordingly

## 2013-06-09 ENCOUNTER — Other Ambulatory Visit: Payer: Self-pay | Admitting: Physician Assistant

## 2013-06-19 ENCOUNTER — Other Ambulatory Visit: Payer: Self-pay | Admitting: Emergency Medicine

## 2013-07-05 ENCOUNTER — Other Ambulatory Visit: Payer: Self-pay | Admitting: Physician Assistant

## 2013-10-03 ENCOUNTER — Other Ambulatory Visit: Payer: Self-pay | Admitting: Physician Assistant

## 2013-10-10 ENCOUNTER — Ambulatory Visit (INDEPENDENT_AMBULATORY_CARE_PROVIDER_SITE_OTHER): Payer: BC Managed Care – PPO | Admitting: Emergency Medicine

## 2013-10-10 VITALS — BP 142/92 | HR 110 | Temp 98.4°F | Resp 17 | Ht 70.5 in | Wt 234.0 lb

## 2013-10-10 DIAGNOSIS — S335XXA Sprain of ligaments of lumbar spine, initial encounter: Secondary | ICD-10-CM

## 2013-10-10 MED ORDER — ACETAMINOPHEN-CODEINE #3 300-30 MG PO TABS: 1.0000 | ORAL_TABLET | ORAL | Status: DC | PRN

## 2013-10-10 MED ORDER — NAPROXEN SODIUM 550 MG PO TABS: 550.0000 mg | ORAL_TABLET | Freq: Two times a day (BID) | ORAL | Status: DC

## 2013-10-10 MED ORDER — CYCLOBENZAPRINE HCL 10 MG PO TABS: 10.0000 mg | ORAL_TABLET | Freq: Three times a day (TID) | ORAL | Status: DC | PRN

## 2013-10-10 MED ORDER — KETOROLAC TROMETHAMINE 60 MG/2ML IM SOLN
60.0000 mg | Freq: Once | INTRAMUSCULAR | Status: AC
  Administered 2013-10-10: 60 mg via INTRAMUSCULAR

## 2013-10-10 NOTE — Addendum Note (Signed)
Addended by: Mercie Eon, Jerman Tinnon B on: 10/10/2013 12:51 PM   Modules accepted: Orders

## 2013-10-10 NOTE — Patient Instructions (Signed)
Lumbosacral Strain Lumbosacral strain is a strain of any of the parts that make up your lumbosacral vertebrae. Your lumbosacral vertebrae are the bones that make up the lower third of your backbone. Your lumbosacral vertebrae are held together by muscles and tough, fibrous tissue (ligaments).  CAUSES  A sudden blow to your back can cause lumbosacral strain. Also, anything that causes an excessive stretch of the muscles in the low back can cause this strain. This is typically seen when people exert themselves strenuously, fall, lift heavy objects, bend, or crouch repeatedly. RISK FACTORS  Physically demanding work.  Participation in pushing or pulling sports or sports that require sudden twist of the back (tennis, golf, baseball).  Weight lifting.  Excessive lower back curvature.  Forward-tilted pelvis.  Weak back or abdominal muscles or both.  Tight hamstrings. SIGNS AND SYMPTOMS  Lumbosacral strain may cause pain in the area of your injury or pain that moves (radiates) down your leg.  DIAGNOSIS Your health care provider can often diagnose lumbosacral strain through a physical exam. In some cases, you may need tests such as X-ray exams.  TREATMENT  Treatment for your lower back injury depends on many factors that your clinician will have to evaluate. However, most treatment will include the use of anti-inflammatory medicines. HOME CARE INSTRUCTIONS   Avoid hard physical activities (tennis, racquetball, waterskiing) if you are not in proper physical condition for it. This may aggravate or create problems.  If you have a back problem, avoid sports requiring sudden body movements. Swimming and walking are generally safer activities.  Maintain good posture.  Maintain a healthy weight.  For acute conditions, you may put ice on the injured area.  Put ice in a plastic bag.  Place a towel between your skin and the bag.  Leave the ice on for 20 minutes, 2 3 times a day.  When the  low back starts healing, stretching and strengthening exercises may be recommended. SEEK MEDICAL CARE IF:  Your back pain is getting worse.  You experience severe back pain not relieved with medicines. SEEK IMMEDIATE MEDICAL CARE IF:   You have numbness, tingling, weakness, or problems with the use of your arms or legs.  There is a change in bowel or bladder control.  You have increasing pain in any area of the body, including your belly (abdomen).  You notice shortness of breath, dizziness, or feel faint.  You feel sick to your stomach (nauseous), are throwing up (vomiting), or become sweaty.  You notice discoloration of your toes or legs, or your feet get very cold. MAKE SURE YOU:   Understand these instructions.  Will watch your condition.  Will get help right away if you are not doing well or get worse. Document Released: 06/12/2005 Document Revised: 06/23/2013 Document Reviewed: 04/21/2013 [employer] Patient Information 2014 [employer].  

## 2013-10-10 NOTE — Progress Notes (Signed)
[facility] [address], [place] [address] [phone]  Date:  10/10/2013   Name:  Blake Foley   DOB:  Nov 26, 1960   MRN:  [id number]  PCP:  Jenny Reichmann, MD    Chief Complaint: Back Pain   History of Present Illness:  Blake Foley is a 53 y.o. very pleasant male patient who presents with the following:  Bent over Friday night to pick up two carpet runners and developed pain in the low back.  Not associated with numbness, tingling, or radiation of pain.  History of prior low back strains.  Works in [employer].  No history of recent injury or overuse.  No improvement with over the counter medications or other home remedies. Denies other complaint or health concern today.   Patient Active Problem List   Diagnosis Date Noted  . Gout 10/28/2011    Past Medical History  Diagnosis Date  . COPD (chronic obstructive pulmonary disease)     Past Surgical History  Procedure Laterality Date  . Knee surgery      History  Substance Use Topics  . Smoking status: Current Every Day Smoker -- 0.50 packs/day for 30 years    Types: Cigarettes  . Smokeless tobacco: Not on file  . Alcohol Use: 1.8 oz/week    3 Cans of beer per week    Family History  Problem Relation Age of Onset  . Stroke Father     No Known Allergies  Medication list has been reviewed and updated.  Current Outpatient Prescriptions on File Prior to Visit  Medication Sig Dispense Refill  . ALPRAZolam (XANAX) 1 MG tablet Take 1/2 to 1 po bid after work prn agitation.Avoid alcohol  60 tablet  3  . Bee Pollen 1000 MG TABS Take by mouth.      . COLCRYS 0.6 MG tablet TAKE 1 TABLET (0.6 MG TOTAL) BY MOUTH 3 (THREE) TIMES DAILY.  30 tablet  2  . GARLIC PO Take by mouth.      Marland Kitchen glucosamine-chondroitin 500-400 MG tablet Take 1 tablet by mouth 3 (three) times daily.      . indomethacin (INDOCIN) 25 MG capsule Take 1 capsule (25 mg total) by mouth 3 (three) times daily with meals.  45  capsule  1  . lisinopril (PRINIVIL,ZESTRIL) 10 MG tablet Take 1 tablet (10 mg total) by mouth daily. PATIENT NEEDS OFFICE VISIT FOR ADDITIONAL REFILLS  30 tablet  0  . Multiple Vitamin (MULTIVITAMIN) capsule Take 1 capsule by mouth daily.      Marland Kitchen PROAIR HFA 108 (90 BASE) MCG/ACT inhaler INHALE 2 PUFFS INTO THE LUNGS EVERY 6 (SIX) HOURS AS NEEDED FOR WHEEZING.  8.5 each  1  . ibuprofen (ADVIL,MOTRIN) 200 MG tablet Take 200 mg by mouth every 6 (six) hours as needed.       No current facility-administered medications on file prior to visit.    Review of Systems:  As per HPI, otherwise negative.    Physical Examination: Filed Vitals:   10/10/13 1138  BP: 142/92  Pulse: 110  Temp: 98.4 F (36.9 C)  Resp: 17   Filed Vitals:   10/10/13 1138  Height: 5' 10.5" (1.791 m)  Weight: 234 lb (106.142 kg)   Body mass index is 33.09 kg/(m^2). Ideal Body Weight: Weight in (lb) to have BMI = 25: 176.4   GEN: WDWN, NAD, Non-toxic, Alert & Oriented x 3 HEENT: Atraumatic, Normocephalic.  Ears and Nose: No external deformity.  EXTR: No clubbing/cyanosis/edema NEURO: antalgic gait.  Gross motor intact PSYCH: Normally interactive. Conversant. Not depressed or anxious appearing.  Calm demeanor.  BACK:  Tender bilateral paraspinous muscles  Assessment and Plan: Lumbosacral strain Anaprox Flexeril tyl #3  Signed,  Ellison Carwin, MD

## 2013-10-11 ENCOUNTER — Telehealth: Payer: Self-pay

## 2013-10-11 NOTE — Telephone Encounter (Signed)
PATIENT STATES HE WAS IN THE OFFICE TO SEE DR. ANDERSON YESTERDAY (SUNDAY) FOR A SPRAINED LUMBAR IN HIS BACK. DR. Ouida Sills PRESCRIBED HIM TYLENOL #3 WITH CODEINE. MR. Blake Foley SAID THIS MEDICINE IS TOO STRONG FOR HIM. IT IS MAKING HIM SICK TO HIS STOMACH AND HAVING VOMITING. HE WOULD LIKE TO REQUEST SOMETHING ELSE BE CALLED INTO HIS PHARMACY. BEST PHONE [phone] (CELL)  [facility] [address]   [place]

## 2013-10-11 NOTE — Telephone Encounter (Signed)
Pt can take plain tylenol.  Continue anaprox

## 2013-10-11 NOTE — Telephone Encounter (Signed)
Can we call something else in for pt that won't make him sick?

## 2013-10-11 NOTE — Telephone Encounter (Signed)
Advised pt

## 2013-10-27 ENCOUNTER — Other Ambulatory Visit: Payer: Self-pay | Admitting: Emergency Medicine

## 2013-10-28 ENCOUNTER — Other Ambulatory Visit: Payer: Self-pay | Admitting: Emergency Medicine

## 2013-10-28 NOTE — Telephone Encounter (Signed)
OK to RF

## 2013-11-04 ENCOUNTER — Other Ambulatory Visit: Payer: Self-pay | Admitting: Emergency Medicine

## 2013-11-06 ENCOUNTER — Other Ambulatory Visit: Payer: Self-pay | Admitting: Emergency Medicine

## 2013-11-24 ENCOUNTER — Other Ambulatory Visit: Payer: Self-pay | Admitting: Emergency Medicine

## 2013-12-03 ENCOUNTER — Other Ambulatory Visit: Payer: Self-pay | Admitting: Physician Assistant

## 2013-12-11 ENCOUNTER — Telehealth: Payer: Self-pay

## 2013-12-11 NOTE — Telephone Encounter (Signed)
Patient calling to get a refill on his albuterol inhaler.  [facility] [address]  Best number for questions: [phone]

## 2013-12-12 MED ORDER — ALBUTEROL SULFATE HFA 108 (90 BASE) MCG/ACT IN AERS: 1.0000 | INHALATION_SPRAY | RESPIRATORY_TRACT | Status: DC | PRN

## 2013-12-12 NOTE — Telephone Encounter (Signed)
Rx sent to pharmacy   

## 2013-12-12 NOTE — Telephone Encounter (Signed)
Pt notified that rx was sent in 

## 2013-12-30 ENCOUNTER — Other Ambulatory Visit: Payer: Self-pay | Admitting: Physician Assistant

## 2014-01-21 ENCOUNTER — Other Ambulatory Visit: Payer: Self-pay | Admitting: Physician Assistant

## 2014-02-01 ENCOUNTER — Other Ambulatory Visit: Payer: Self-pay | Admitting: Physician Assistant

## 2014-02-09 ENCOUNTER — Other Ambulatory Visit: Payer: Self-pay | Admitting: Emergency Medicine

## 2014-02-15 ENCOUNTER — Other Ambulatory Visit: Payer: Self-pay | Admitting: Emergency Medicine

## 2014-02-18 ENCOUNTER — Telehealth: Payer: Self-pay

## 2014-02-18 MED ORDER — LISINOPRIL 10 MG PO TABS: 10.0000 mg | ORAL_TABLET | Freq: Every day | ORAL | Status: DC

## 2014-02-18 NOTE — Telephone Encounter (Signed)
Pt called back after closing and was told that we would send in RF. He has been notified on last two RFs that he needs OV. I will give him 7 tablets, no more w/out OV

## 2014-02-18 NOTE — Telephone Encounter (Signed)
Patient is out of blood pressure  Medication  lisinopril (PRINIVIL,ZESTRIL) 10 MG tablet  [phone]  [facility] [address]

## 2014-04-18 ENCOUNTER — Telehealth: Payer: Self-pay

## 2014-04-18 ENCOUNTER — Ambulatory Visit (INDEPENDENT_AMBULATORY_CARE_PROVIDER_SITE_OTHER): Payer: BC Managed Care – PPO | Admitting: Physician Assistant

## 2014-04-18 ENCOUNTER — Other Ambulatory Visit: Payer: Self-pay | Admitting: Emergency Medicine

## 2014-04-18 VITALS — BP 164/70 | HR 111 | Temp 98.6°F | Resp 18 | Ht 72.0 in | Wt 238.0 lb

## 2014-04-18 DIAGNOSIS — M62838 Other muscle spasm: Secondary | ICD-10-CM

## 2014-04-18 DIAGNOSIS — M109 Gout, unspecified: Secondary | ICD-10-CM

## 2014-04-18 DIAGNOSIS — I10 Essential (primary) hypertension: Secondary | ICD-10-CM | POA: Insufficient documentation

## 2014-04-18 DIAGNOSIS — F1010 Alcohol abuse, uncomplicated: Secondary | ICD-10-CM

## 2014-04-18 MED ORDER — ALLOPURINOL 100 MG PO TABS: 100.0000 mg | ORAL_TABLET | Freq: Every day | ORAL | Status: DC

## 2014-04-18 MED ORDER — OXYCODONE-ACETAMINOPHEN 10-325 MG PO TABS: 1.0000 | ORAL_TABLET | Freq: Three times a day (TID) | ORAL | Status: DC | PRN

## 2014-04-18 MED ORDER — CYCLOBENZAPRINE HCL 10 MG PO TABS: 10.0000 mg | ORAL_TABLET | Freq: Three times a day (TID) | ORAL | Status: DC | PRN

## 2014-04-18 MED ORDER — COLCHICINE 0.6 MG PO TABS: 0.6000 mg | ORAL_TABLET | Freq: Three times a day (TID) | ORAL | Status: DC

## 2014-04-18 MED ORDER — LISINOPRIL 10 MG PO TABS: 10.0000 mg | ORAL_TABLET | Freq: Every day | ORAL | Status: DC

## 2014-04-18 MED ORDER — HYDROCODONE-ACETAMINOPHEN 5-325 MG PO TABS
1.0000 | ORAL_TABLET | Freq: Four times a day (QID) | ORAL | Status: DC | PRN
Start: 2014-04-18 — End: 2014-06-26

## 2014-04-18 NOTE — Patient Instructions (Signed)
Start the allopurinol (which is to prevent gout attacks) - after this gout attack goes away.  Recheck with me in a month to recheck gout medications and blood pressure.

## 2014-04-18 NOTE — Telephone Encounter (Signed)
Pt states that his gout pain is stronger than moderate pain. He has to take 2-4 tablets in order for them to work. He is in horrible pain and wants something stronger so he has to take less tablets.

## 2014-04-18 NOTE — Telephone Encounter (Signed)
That is to much medication - he can take 2 pills every 4 hours but that is the max dose for that medication.  If he takes more than that it is to much Tylenol.  I can write him for some Percocet but I will need the Vicodin Rx back.  Rx is up front.

## 2014-04-18 NOTE — Telephone Encounter (Signed)
Pt said he saw Dr.Weber for gout,and was prescribed something for pain, and realized [facility] that [facility] is not going to be strong enough. He would like to be prescribed something stronger. Please advise pt

## 2014-04-18 NOTE — Telephone Encounter (Signed)
Judson Roch, I'm not sure why pt didn't ask for this when he was here at appt? Just wanted to check w/you before denying this to make sure it wasn't discussed and you want to give a RF.

## 2014-04-18 NOTE — Telephone Encounter (Signed)
Per [place] called to say that he would rather have Percocet vs Vicodin. Ok to do. Rx printed and signed at nurses station. Pt must bring back Vicodin rx. Left message on machine to call back

## 2014-04-18 NOTE — Progress Notes (Signed)
   Subjective:    Patient ID: Blake Foley, male    DOB: 1961/08/04, 53 y.o.   MRN: [id number]  HPI  Pt presents to clinic with gout attack in his right wrist and elbow and left shoulder.  He had an attack in his right ankle about 2 weeks ago and he used Colcrys which stopped the attack.  He feels like this one started about the time he worked some extra hours at work.  He can almost not move either one of his arms due to his pain. He has used some of his wives pain medications because he was out of his gout medications.  He has never been on preventative gout medications.  He has decreased his ETOH intake but he still drinks 4-5 beers a night.  He has not eaten more purine containing foods recently. He has been out of his BP medications for about a month - he feels no differently off them vs being on them.  Review of Systems  Cardiovascular: Negative for chest pain.       Objective:   Physical Exam  Vitals reviewed. Constitutional: He is oriented to person, place, and time. He appears well-developed and well-nourished.  HENT:  Head: Normocephalic and atraumatic.  Right Ear: External ear normal.  Left Ear: External ear normal.  Cardiovascular: Normal rate, regular rhythm and normal heart sounds.   No murmur heard. Pulmonary/Chest: Effort normal and breath sounds normal.  Musculoskeletal:       Left shoulder: He exhibits decreased range of motion, tenderness and bony tenderness.       Right elbow: He exhibits decreased range of motion and swelling. Tenderness (generalized TTP) found.       Right wrist: He exhibits tenderness, bony tenderness and swelling (also erythematous and warm).  Neurological: He is alert and oriented to person, place, and time.  Skin: Skin is warm and dry.  Psychiatric: He has a normal mood and affect. His behavior is normal. Judgment and thought content normal.       Assessment & Plan:  Essential hypertension - Plan: COMPLETE METABOLIC PANEL WITH GFR,  lisinopril (PRINIVIL,ZESTRIL) 10 MG tablet,   ETOH abuse  Acute gout of right shoulder, unspecified cause - Plan: Uric Acid, HYDROcodone-acetaminophen (NORCO/VICODIN) 5-325 MG per tablet, colchicine (COLCRYS) 0.6 MG tablet, allopurinol (ZYLOPRIM) 100 MG tablet  Muscle spasms of neck - Plan: cyclobenzaprine (FLEXERIL) 10 MG tablet  Pt is having an acute gout attack - he has never been on preventative medication and is interested in starting them - we will start Allopurinol once this attack is gone.  He will recheck in 1 month his uric acid levels once starting the medications to titrate his allopurinol dose.  This will also give [place] time to recheck his BP while on medications and not in pain.  Windell Hummingbird PA-C  [facility] 04/18/2014 2:11 PM

## 2014-04-19 LAB — COMPLETE METABOLIC PANEL WITH GFR
ALBUMIN: 4.5 g/dL (ref 3.5–5.2)
ALT: 37 U/L (ref 0–53)
AST: 25 U/L (ref 0–37)
Alkaline Phosphatase: 93 U/L (ref 39–117)
BUN: 9 mg/dL (ref 6–23)
CO2: 23 meq/L (ref 19–32)
Calcium: 9.6 mg/dL (ref 8.4–10.5)
Chloride: 97 mEq/L (ref 96–112)
Creat: 0.62 mg/dL (ref 0.50–1.35)
GFR, Est African American: >89 mL/min
GLUCOSE: 120 mg/dL — AB (ref 70–99)
POTASSIUM: 4.4 meq/L (ref 3.5–5.3)
SODIUM: 134 meq/L — AB (ref 135–145)
Total Bilirubin: 0.8 mg/dL (ref 0.2–1.2)
Total Protein: 7.4 g/dL (ref 6.0–8.3)

## 2014-04-19 LAB — URIC ACID: Uric Acid, Serum: 7.4 mg/dL (ref 4.0–7.8)

## 2014-04-19 NOTE — Telephone Encounter (Signed)
Duplicate message. 

## 2014-04-19 NOTE — Telephone Encounter (Signed)
Pt advised. He has taken about 7 of the Vicodin but will bring in the rest of the bottle.

## 2014-04-19 NOTE — Telephone Encounter (Signed)
10 tablets out of 20 dispensed were returned to 102 and I showed to Kathleen who had been involved with this encounter and then put in medication discard container.

## 2014-04-20 LAB — HEPATIC FUNCTION PANEL
ALK PHOS: 103 U/L (ref 25–125)
ALT: 43 U/L — AB (ref 10–40)
AST: 43 U/L — AB (ref 14–40)
BILIRUBIN, TOTAL: 0.5 mg/dL
Bilirubin, Direct: 0.21 mg/dL (ref 0.01–0.4)

## 2014-04-20 LAB — LIPID PANEL
Cholesterol: 142 mg/dL (ref 0–200)
HDL: 51 mg/dL (ref 35–70)
LDL CALC: 65 mg/dL
LDl/HDL Ratio: 2.6
TRIGLYCERIDES: 113 mg/dL (ref 40–160)

## 2014-04-20 LAB — BASIC METABOLIC PANEL
BUN: 8 mg/dL (ref 4–21)
CREATININE: 0.8 mg/dL (ref 0.6–1.3)
Glucose: 101 mg/dL
Potassium: 4.3 mmol/L (ref 3.4–5.3)
Sodium: 133 mmol/L — AB (ref 137–147)

## 2014-04-20 LAB — CBC AND DIFFERENTIAL
HEMATOCRIT: 45 % (ref 41–53)
Hemoglobin: 15.3 g/dL (ref 13.5–17.5)
WBC: 10.5 10*3/mL

## 2014-04-21 NOTE — Telephone Encounter (Signed)
Ready

## 2014-04-21 NOTE — Telephone Encounter (Signed)
Faxed. Notified pt sent but that (per Judson Roch) he needs to return before next RF, he didn't discuss w/Sarah when he was here.

## 2014-05-01 ENCOUNTER — Encounter: Payer: Self-pay | Admitting: Physician Assistant

## 2014-05-01 LAB — PSA: PSA: 0.5

## 2014-05-15 ENCOUNTER — Other Ambulatory Visit: Payer: Self-pay | Admitting: Physician Assistant

## 2014-05-18 ENCOUNTER — Encounter: Payer: Self-pay | Admitting: Family Medicine

## 2014-05-18 NOTE — Telephone Encounter (Signed)
Faxed Rx

## 2014-06-22 ENCOUNTER — Telehealth: Payer: Self-pay

## 2014-06-22 DIAGNOSIS — M109 Gout, unspecified: Secondary | ICD-10-CM

## 2014-06-22 NOTE — Telephone Encounter (Signed)
Patient requesting refill on "Colcrys" and "Allopurinol" to be sent to [facility] [address]. Patient aware he is due for a reck with Windell Hummingbird. I did transfer him to the appointment center to make an appointment. Patients call back number is [phone]

## 2014-06-23 MED ORDER — ALLOPURINOL 100 MG PO TABS: ORAL_TABLET | ORAL | Status: DC

## 2014-06-23 MED ORDER — COLCHICINE 0.6 MG PO TABS: 0.6000 mg | ORAL_TABLET | Freq: Three times a day (TID) | ORAL | Status: DC

## 2014-06-23 NOTE — Telephone Encounter (Signed)
Per last refill- pt needs follow up visit to check labs.

## 2014-06-23 NOTE — Telephone Encounter (Signed)
Has a flare up of gout in left elbow and right knee.  Needing some Colcrys and Allopuinol.  [phone]  [facility] [address]

## 2014-06-23 NOTE — Telephone Encounter (Signed)
Pt stated that he couldn't set up appt w/Sarah until after first of year so he plans to come see her next Thurs. I advised that I will send in 2 week RFs for him to cover until then. Pt agreed.

## 2014-06-24 ENCOUNTER — Other Ambulatory Visit: Payer: Self-pay | Admitting: Physician Assistant

## 2014-06-25 ENCOUNTER — Telehealth: Payer: Self-pay

## 2014-06-25 DIAGNOSIS — M109 Gout, unspecified: Secondary | ICD-10-CM

## 2014-06-25 NOTE — Telephone Encounter (Signed)
PT IS EXPERIENCING A GOUT FLARE UP AND IS REQUESTING A REFILL OF PAIN MEDICINE. HE SAID HE CANNOT COME IN BECAUSE HE CANNOT WALK.

## 2014-06-25 NOTE — Telephone Encounter (Signed)
Looks like we also approved a RF of his Allopurinol yesterday.  Pt called back. And wanted to know if we can give him some pain meds for his current flare. Says it is very painful. He is coming in to recheck with you on Thurs. Please advise. Thanks

## 2014-06-25 NOTE — Telephone Encounter (Signed)
Pt saw Windell Hummingbird 04/18/14 for gout. "Pt is having an acute gout attack - he has never been on preventative medication and is interested in starting them - we will start Allopurinol once this attack is gone. He will recheck in 1 month his uric acid levels once starting the medications to titrate his allopurinol dose. This will also give [place] time to recheck his BP while on medications and not in pain."  Left message on machine to call back

## 2014-06-25 NOTE — Telephone Encounter (Signed)
He will have to come in. I have not seen him for over a year

## 2014-06-26 MED ORDER — HYDROCODONE-ACETAMINOPHEN 5-325 MG PO TABS: 1.0000 | ORAL_TABLET | Freq: Four times a day (QID) | ORAL | Status: DC | PRN

## 2014-06-26 NOTE — Telephone Encounter (Signed)
Pt has been using colchicine for a few days and it is not helping. Can we rx a few pain pills?

## 2014-06-26 NOTE — Telephone Encounter (Signed)
Done

## 2014-06-26 NOTE — Telephone Encounter (Signed)
Left message on machine to call back  

## 2014-06-26 NOTE — Telephone Encounter (Signed)
He should be using the colchicine and that should help with the pain as well as the gout problem.  If he is has been using that and he is insistent about pain medication he will have to come and pick a Rx for a few.

## 2014-06-26 NOTE — Telephone Encounter (Signed)
Pt notified that this is ready for p/u 

## 2014-07-07 ENCOUNTER — Other Ambulatory Visit: Payer: Self-pay | Admitting: Emergency Medicine

## 2014-07-19 ENCOUNTER — Other Ambulatory Visit: Payer: Self-pay | Admitting: Physician Assistant

## 2014-07-21 ENCOUNTER — Other Ambulatory Visit: Payer: Self-pay | Admitting: Physician Assistant

## 2014-07-25 NOTE — Telephone Encounter (Signed)
LMOM to CB. F/up plans? Pt is overdue for BP and uric acid check.

## 2014-08-01 NOTE — Telephone Encounter (Signed)
Reached pt who stated he will get in by next Mon or Tues. I advised I will send in 1 more week of med.

## 2014-08-07 ENCOUNTER — Other Ambulatory Visit: Payer: Self-pay | Admitting: Physician Assistant

## 2014-08-22 ENCOUNTER — Ambulatory Visit (INDEPENDENT_AMBULATORY_CARE_PROVIDER_SITE_OTHER): Payer: BC Managed Care – PPO

## 2014-08-22 ENCOUNTER — Ambulatory Visit (INDEPENDENT_AMBULATORY_CARE_PROVIDER_SITE_OTHER): Payer: BC Managed Care – PPO | Admitting: Family Medicine

## 2014-08-22 VITALS — BP 144/82 | HR 87 | Temp 97.8°F | Resp 18 | Ht 72.0 in | Wt 242.0 lb

## 2014-08-22 DIAGNOSIS — M25521 Pain in right elbow: Secondary | ICD-10-CM

## 2014-08-22 DIAGNOSIS — M10011 Idiopathic gout, right shoulder: Secondary | ICD-10-CM

## 2014-08-22 DIAGNOSIS — M109 Gout, unspecified: Secondary | ICD-10-CM

## 2014-08-22 DIAGNOSIS — M10021 Idiopathic gout, right elbow: Secondary | ICD-10-CM

## 2014-08-22 DIAGNOSIS — I10 Essential (primary) hypertension: Secondary | ICD-10-CM

## 2014-08-22 LAB — POCT CBC
Granulocyte percent: 63.2 %G (ref 37–80)
HCT, POC: 48.2 % (ref 43.5–53.7)
Hemoglobin: 16.1 g/dL (ref 14.1–18.1)
Lymph, poc: 2.7 (ref 0.6–3.4)
MCH, POC: 31.1 pg (ref 27–31.2)
MCHC: 33.4 g/dL (ref 31.8–35.4)
MCV: 93 fL (ref 80–97)
MID (CBC): 0.7 (ref 0–0.9)
MPV: 6.9 fL (ref 0–99.8)
PLATELET COUNT, POC: 261 10*3/uL (ref 142–424)
POC GRANULOCYTE: 5.9 (ref 2–6.9)
POC LYMPH %: 29.4 % (ref 10–50)
POC MID %: 7.4 % (ref 0–12)
RBC: 5.18 M/uL (ref 4.69–6.13)
RDW, POC: 13 %
WBC: 9.3 10*3/uL (ref 4.6–10.2)

## 2014-08-22 LAB — URIC ACID: Uric Acid, Serum: 6.9 mg/dL (ref 4.0–7.8)

## 2014-08-22 MED ORDER — LISINOPRIL 10 MG PO TABS: 10.0000 mg | ORAL_TABLET | Freq: Every day | ORAL | Status: DC

## 2014-08-22 MED ORDER — NAPROXEN SODIUM 550 MG PO TABS: ORAL_TABLET | ORAL | Status: DC

## 2014-08-22 MED ORDER — HYDROCODONE-ACETAMINOPHEN 5-325 MG PO TABS: 1.0000 | ORAL_TABLET | Freq: Four times a day (QID) | ORAL | Status: DC | PRN

## 2014-08-22 MED ORDER — COLCHICINE 0.6 MG PO TABS: ORAL_TABLET | ORAL | Status: DC

## 2014-08-22 NOTE — Patient Instructions (Signed)
I think that you do have gout in your elbow.  Use the colchicine as directed, and try the vicodin and naproxen as needed.  Let me know if you are not better in the next few days Go back on your lisinopril for your blood pressure/   Please let me know if you are not better in the next few days- Sooner if worse.

## 2014-08-22 NOTE — Progress Notes (Signed)
[facility] [address], [place] [address] [phone]  Date:  08/22/2014   Name:  Blake Foley   DOB:  1960-10-13   MRN:  [id number]  PCP:  Jenny Reichmann, MD    Chief Complaint: Shoulder Pain and Elbow Pain   History of Present Illness:  Blake Foley is a 53 y.o. very pleasant male patient who presents with the following:  He is here today because he has noted a problem with his right elbow.  Pain in his elbow started 2 weeks ago.  He is not aware of any injury but he is active at his job.  It "does not seem like gout" to him but he does have a long history of gout. He has also noted some right shoulder pain over the same time period but not as bad.   He notes that the elbow pain comes and goes, but is more persistent over the last few days.   Using a wrench at work makes the elbow hurt more.  He did try taking his colchicine and allopurinol which helped but then he stopped using them/   He states that he has not been able to sleep due to pain.   He works on Furniture conservator/restorer.    He states he has taken some tramadol for this, naproxen and is now taking his wife's hydrocodone 10 mg.   [place] [facility] is giving him tramadol as needed for his knee issues  He is out of his lisinopril Patient Active Problem List   Diagnosis Date Noted  . HTN (hypertension) 04/18/2014  . ETOH abuse 04/18/2014  . Gout 10/28/2011    Past Medical History  Diagnosis Date  . COPD (chronic obstructive pulmonary disease)     Past Surgical History  Procedure Laterality Date  . Knee surgery      History  Substance Use Topics  . Smoking status: Current Every Day Smoker -- 0.50 packs/day for 30 years    Types: Cigarettes  . Smokeless tobacco: Not on file  . Alcohol Use: 1.8 oz/week    3 Cans of beer per week    Family History  Problem Relation Age of Onset  . Stroke Father     No Known Allergies  Medication list has been reviewed and  updated.  Current Outpatient Prescriptions on File Prior to Visit  Medication Sig Dispense Refill  . albuterol (PROAIR HFA) 108 (90 BASE) MCG/ACT inhaler Inhale 1 puff into the lungs every 4 (four) hours as needed for wheezing or shortness of breath. 8.5 each 1  . allopurinol (ZYLOPRIM) 100 MG tablet Take 1 - 2 tablets every day, starte w/ 1 daily then increase to 2 tabs daily after 1 week. PATIENT NEEDS OFFICE VISIT FOR ADDITIONAL REFILLS - due for re-check. 30 tablet 0  . ALPRAZolam (XANAX) 1 MG tablet TAKE 1/2 TO 1 TABLET BY MOUTH TWICE A DAY AFTER WORK FOR AGITATION 30 tablet 0  . Bee Pollen 1000 MG TABS Take by mouth.    . colchicine 0.6 MG tablet Take 1 tablet (0.6 mg total) by mouth 3 (three) times daily. NO MORE REFILLS WITHOUT OFFICE VISIT - 2ND NOTICE - overdue for follow up 15 tablet 0  . GARLIC PO Take by mouth.    Marland Kitchen glucosamine-chondroitin 500-400 MG tablet Take 1 tablet by mouth 3 (three) times daily.    Marland Kitchen HYDROcodone-acetaminophen (NORCO/VICODIN) 5-325 MG per tablet Take 1 tablet by mouth every 6 (six) hours as  needed for moderate pain. 20 tablet 0  . ibuprofen (ADVIL,MOTRIN) 200 MG tablet Take 200 mg by mouth every 6 (six) hours as needed.    . Multiple Vitamin (MULTIVITAMIN) capsule Take 1 capsule by mouth daily.    . naproxen sodium (ANAPROX) 550 MG tablet Take 1 tablet by mouth twice daily. NO MORE REFILLS WITHOUT OFFICE VISIT - FINAL NOTICE 14 tablet 0  . oxyCODONE-acetaminophen (PERCOCET) 10-325 MG per tablet Take 1 tablet by mouth every 8 (eight) hours as needed for pain. 20 tablet 0  . cyclobenzaprine (FLEXERIL) 10 MG tablet Take 1 tablet (10 mg total) by mouth 3 (three) times daily as needed for muscle spasms. (Patient not taking: Reported on 08/22/2014) 30 tablet 0  . lisinopril (PRINIVIL,ZESTRIL) 10 MG tablet Take 1 tablet (10 mg total) by mouth daily. (Patient not taking: Reported on 08/22/2014) 30 tablet 0   No current facility-administered medications on file prior to  visit.    Review of Systems:  As per HPI- otherwise negative.   Physical Examination: Filed Vitals:   08/22/14 1257  BP: 144/82  Pulse: 87  Temp: 97.8 F (36.6 C)  Resp: 18   Filed Vitals:   08/22/14 1257  Height: 6' (1.829 m)  Weight: 242 lb (109.77 kg)   Body mass index is 32.81 kg/(m^2). Ideal Body Weight: Weight in (lb) to have BMI = 25: 183.9  GEN: WDWN, NAD, Non-toxic, A & O x 3, obese, looks well HEENT: Atraumatic, Normocephalic. Neck supple. No masses, No LAD. Ears and Nose: No external deformity. CV: RRR, No M/G/R. No JVD. No thrill. No extra heart sounds. PULM: CTA B, no wheezes, crackles, rhonchi. No retractions. No resp. distress. No accessory muscle use. EXTR: No c/c/e NEURO Normal gait.  PSYCH: Normally interactive. Conversant. Not depressed or anxious appearing.  Calm demeanor.  Right elbow:  He has extreme tenderness over the posterior elbow.  Normal ROM, minimal swelling, no redness or heat.  Pain out of proprotion to exam does suggest gout  [facility] reading (PRIMARY) by  Dr. Lorelei Pont. Right elbow: negative for effusion, fracture or dislocation RIGHT ELBOW - COMPLETE 3+ VIEW  COMPARISON: None.  FINDINGS: There is no fracture or dislocation or joint effusion. There is suggestion of faint calcifications in the origin of the common flexor tendon at the medial epicondyle of the distal humerus. There is also slight calcification in the distal Achilles tendon and there is a tiny osteophyte on the tip of the coronoid process.  IMPRESSION: No acute abnormality. Minimal degenerative changes.  Reviewed controlled substance report: he has received tramadol #90 on 11/25 and 10/23.  He last received a stronger narcotic on 06/27/14- #20 hydrocodone 5  Results for orders placed or performed in visit on 08/22/14  POCT CBC  Result Value Ref Range   WBC 9.3 4.6 - 10.2 K/uL   Lymph, poc 2.7 0.6 - 3.4   POC LYMPH PERCENT 29.4 10 - 50 %L   MID (cbc) 0.7 0 - 0.9    POC MID % 7.4 0 - 12 %M   POC Granulocyte 5.9 2 - 6.9   Granulocyte percent 63.2 37 - 80 %G   RBC 5.18 4.69 - 6.13 M/uL   Hemoglobin 16.1 14.1 - 18.1 g/dL   HCT, POC 48.2 43.5 - 53.7 %   MCV 93.0 80 - 97 fL   MCH, POC 31.1 27 - 31.2 pg   MCHC 33.4 31.8 - 35.4 g/dL   RDW, POC 13.0 %   Platelet Count,  POC 261 142 - 424 K/uL   MPV 6.9 0 - 99.8 fL      Assessment and Plan: Right elbow pain - Plan: POCT CBC, Uric Acid, DG Elbow Complete Right  Essential hypertension - Plan: lisinopril (PRINIVIL,ZESTRIL) 10 MG tablet  Acute gout of right shoulder, unspecified cause  Acute idiopathic gout of right elbow - Plan: naproxen sodium (ANAPROX) 550 MG tablet, colchicine 0.6 MG tablet, HYDROcodone-acetaminophen (NORCO/VICODIN) 5-325 MG per tablet  Suspect gout right elbow.  Will treat with colchicine, naproxen and hydrocodone as needed Follow-up if not better! Will plan further follow- up pending labs.   Signed Lamar Blinks, MD

## 2014-08-23 ENCOUNTER — Telehealth: Payer: Self-pay

## 2014-08-23 NOTE — Telephone Encounter (Signed)
Patient was seen yesterday and was prescribed hydrocodone/ Patient states that it isn't doing anything has decided to stop the medication. Please call patient: [phone]

## 2014-08-23 NOTE — Telephone Encounter (Signed)
Dr Lorelei Pont note indicates she suspect gout right elbow. Will treat with colchicine, naproxen and hydrocodone as needed Follow-up if not better!  Called him, he states first he is not taking the colchicine, then states he is. He states he can not take the naproxen, it does not help. He does not want to take Naproxen, he does not answer my questions appropriately when we are discussing medication, changing his answers. He has been advised to use the colchicine and to use the hydrocodone until the colchicine starts to work, will take a couple days, he is advised to return to clinic if worse.

## 2014-08-23 NOTE — Telephone Encounter (Signed)
Called but he did not answer.  LMOM.  I am not quite sure from the message if his elbow is better or worse.  If it is not better or if getting worse I would like to have him see ortho tomorrow.  Asked him to call back with an update and I will also try him again tomorrow

## 2014-08-24 ENCOUNTER — Encounter: Payer: Self-pay | Admitting: Family Medicine

## 2014-08-24 NOTE — Telephone Encounter (Signed)
We called again- still no answer

## 2014-08-25 ENCOUNTER — Telehealth: Payer: Self-pay

## 2014-08-25 NOTE — Telephone Encounter (Signed)
Called again and was able to [facility]. Reminded him that he does have hydrocodone which is stronger than naproxen.  However at this point I do think he should come in for a recheck today or tomorrow

## 2014-08-25 NOTE — Telephone Encounter (Signed)
Called twice but no answer and the line would cut off and not allow me to leave a message.  I did also rx his vicodin at our visit- has he tried this?   In any case I would like him to come in for a recheck to make sure this is not something else like a joint infection

## 2014-08-25 NOTE — Telephone Encounter (Signed)
PATIENT STATES HE SAW DR. Lorelei Pont ON Monday FOR PAIN IN HIS (R) ELBOW. DR. Lorelei Pont CALLED AND LEFT HIM A MESSAGE LAST NIGHT THAT THE X-RAYS DIDN'T REALLY SHOW ANYTHING. SHE THINKS IT MAY BE GOUT, BUT HE THINKS THAT IT IS SOMETHING ELSE. HE SAID IT DOES NOT HURT AS BAD AS GOUT DOES. HE MAY NEED TO BE REFERRED TO A BONE SPECIALIST, BUT IN THE MEANTIME, HE NEEDS SOMETHING ELSE FOR THE PAIN. THE NAPROXEN IS NOT HELPING. HE TAKES 2 A DAY. BEST PHONE [phone] (CELL)  [facility] [address]   [place]

## 2014-09-03 ENCOUNTER — Other Ambulatory Visit: Payer: Self-pay | Admitting: Physician Assistant

## 2014-09-08 ENCOUNTER — Telehealth: Payer: Self-pay

## 2014-09-08 ENCOUNTER — Other Ambulatory Visit: Payer: Self-pay | Admitting: Physician Assistant

## 2014-09-08 MED ORDER — ALPRAZOLAM 1 MG PO TABS: 0.5000 mg | ORAL_TABLET | Freq: Two times a day (BID) | ORAL | Status: DC | PRN

## 2014-09-08 NOTE — Telephone Encounter (Signed)
Ready

## 2014-09-08 NOTE — Telephone Encounter (Signed)
Faxed and pt notified.

## 2014-09-08 NOTE — Telephone Encounter (Signed)
Pt would like his xanax refilled. Has not had in some time but is trying to quit smoking and needs it.  [facility] [address]

## 2014-09-19 ENCOUNTER — Other Ambulatory Visit: Payer: Self-pay | Admitting: Family Medicine

## 2014-09-19 NOTE — Telephone Encounter (Signed)
Pt Blake Foley and reported that he is still having elbow pain. He says it does not bother him every day, but feels that it is getting worse. States that he definitely would like referral to bone specialist and would like something to help w/pain until then. He states that he is working 19 days straight, and will not have another day off until the 24th of 25th, but can come in then if needed. Pt advised that the naproxen doesn't work and I denied the req for RF sent from pharm Blake Foley pt does not want this. He also stated that the 5-325 mg hydrocodone doesn't help, but the 10-325 does. Pt reqs Rx for this strength if possible. Dr Lorelei Pont, please advise on Rx, referral and if you need to see pt back on 1/24 or 1/25?

## 2014-09-19 NOTE — Telephone Encounter (Signed)
See notes under 12/8 and 12/10 encounters. Is pt's elbow better? Does he want the naproxen for elbow pain or something else? Dr Lorelei Pont had wanted to see pt for recheck if his elbow pain has not resolved. LMOM for pt to CB.

## 2014-09-20 NOTE — Telephone Encounter (Signed)
Called and LMOM.  I am sorry that his elbow is still troubling him.  I have not seen him in a month however and if his elbow still hurts to the point of requiring hydrocodone 10mg  we need to find out why.  It looks like he has worked with [facility] in the past.  Please either call them or come and see [place] (reminded him that we are open until 8:30 pm and also on Saturday and Sunday) for evaluation.  Cannot refill narcotics without a visit.

## 2014-10-04 ENCOUNTER — Other Ambulatory Visit: Payer: Self-pay | Admitting: Physician Assistant

## 2014-10-06 ENCOUNTER — Telehealth: Payer: Self-pay

## 2014-10-06 NOTE — Telephone Encounter (Signed)
Pt called to return a call. Please advise. Cb # [phone]

## 2014-10-06 NOTE — Telephone Encounter (Signed)
I have refilled his Xanax - if the patient continues to use this much he needs an OV to discuss a daily medication.

## 2014-10-06 NOTE — Telephone Encounter (Signed)
Faxed and LMOM for pt advising that he needs to RTC to discuss meds if he continues to need this amount of xanax.

## 2014-10-09 ENCOUNTER — Ambulatory Visit (INDEPENDENT_AMBULATORY_CARE_PROVIDER_SITE_OTHER): Payer: BLUE CROSS/BLUE SHIELD | Admitting: Emergency Medicine

## 2014-10-09 ENCOUNTER — Other Ambulatory Visit: Payer: Self-pay | Admitting: Emergency Medicine

## 2014-10-09 ENCOUNTER — Telehealth: Payer: Self-pay

## 2014-10-09 VITALS — BP 150/80 | HR 102 | Temp 97.8°F | Resp 18 | Ht 71.0 in | Wt 236.0 lb

## 2014-10-09 DIAGNOSIS — M10021 Idiopathic gout, right elbow: Secondary | ICD-10-CM

## 2014-10-09 DIAGNOSIS — M25521 Pain in right elbow: Secondary | ICD-10-CM

## 2014-10-09 LAB — BASIC METABOLIC PANEL WITH GFR
BUN: 7 mg/dL (ref 6–23)
CO2: 28 meq/L (ref 19–32)
CREATININE: 0.75 mg/dL (ref 0.50–1.35)
Calcium: 9.6 mg/dL (ref 8.4–10.5)
Chloride: 99 mEq/L (ref 96–112)
Glucose, Bld: 103 mg/dL — ABNORMAL HIGH (ref 70–99)
POTASSIUM: 5.3 meq/L (ref 3.5–5.3)
Sodium: 137 mEq/L (ref 135–145)

## 2014-10-09 LAB — POCT CBC
GRANULOCYTE PERCENT: 66.8 % (ref 37–80)
HEMATOCRIT: 51.4 % (ref 43.5–53.7)
Hemoglobin: 16.8 g/dL (ref 14.1–18.1)
Lymph, poc: 2.1 (ref 0.6–3.4)
MCH: 31.7 pg — AB (ref 27–31.2)
MCHC: 32.7 g/dL (ref 31.8–35.4)
MCV: 96.7 fL (ref 80–97)
MID (CBC): 0.5 (ref 0–0.9)
MPV: 6.6 fL (ref 0–99.8)
POC Granulocyte: 5.3 (ref 2–6.9)
POC LYMPH %: 27.2 % (ref 10–50)
POC MID %: 6 %M (ref 0–12)
Platelet Count, POC: 261 10*3/uL (ref 142–424)
RBC: 5.31 M/uL (ref 4.69–6.13)
RDW, POC: 13.6 %
WBC: 7.9 10*3/uL (ref 4.6–10.2)

## 2014-10-09 LAB — URIC ACID: Uric Acid, Serum: 6.8 mg/dL (ref 4.0–7.8)

## 2014-10-09 MED ORDER — ALLOPURINOL 100 MG PO TABS: 100.0000 mg | ORAL_TABLET | Freq: Every day | ORAL | Status: DC

## 2014-10-09 MED ORDER — COLCHICINE 0.6 MG PO TABS: ORAL_TABLET | ORAL | Status: DC

## 2014-10-09 MED ORDER — HYDROCODONE-ACETAMINOPHEN 5-325 MG PO TABS: 1.0000 | ORAL_TABLET | Freq: Four times a day (QID) | ORAL | Status: DC | PRN

## 2014-10-09 MED ORDER — HYDROCODONE-ACETAMINOPHEN 7.5-325 MG PO TABS: 1.0000 | ORAL_TABLET | Freq: Four times a day (QID) | ORAL | Status: DC | PRN

## 2014-10-09 NOTE — Telephone Encounter (Signed)
Patient was seen today and forgot to ask for his allopurinol (ZYLOPRIM) 100 MG tablet Requesting 90 day supply   [phone]  [facility] [address]

## 2014-10-09 NOTE — Progress Notes (Addendum)
   Subjective:    Patient ID: Blake Foley, male    DOB: 08/07/1961, 55 y.o.   MRN: [id number]  This chart was scribed for Jenny Reichmann, MD by Stephania Fragmin, ED Scribe. This patient was seen in room 4 and the patient's care was started at 11:49 AM.   HPI HPI Comments:   Patient is a patient of mine who has previously been seen for gout.    Blake Foley is a 54 y.o. male with a history of gout who presents [facility] complaining of intermittent, severe, bilateral elbow pain, right greater than left, that began 2 days ago. He complains of associated sleep disturbance due to the pain, even though he takes Xanax at night. Patient has been regularly taking colchicine. His last gout flare-up was over 5 months ago. He states he drinks plenty of fluids. Patient has been cutting back on smoking (currently 3-5 cigarettes / day) and EtOH consumption (almost 0 drinks / day). Patient works from 6 AM - 7 PM, Monday through Friday.   Review of Systems  Musculoskeletal: Positive for myalgias, joint swelling and arthralgias.       Objective:   Physical Exam  Nursing note and vitals reviewed.   CONSTITUTIONAL: Well developed/well nourished HEAD: Normocephalic/atraumatic EYES: EOMI/PERRL ENMT: Mucous membranes moist NECK: supple no meningeal signs SPINE/BACK:entire spine nontender CV: S1/S2 noted, no murmurs/rubs/gallops noted LUNGS: Lungs are clear to auscultation bilaterally, no apparent distress ABDOMEN: soft, nontender, no rebound or guarding, bowel sounds noted throughout abdomen GU:no cva tenderness NEURO: Pt is awake/alert/appropriate, moves all extremitiesx4.  No facial droop.   EXTREMITIES: pulses normal/equal, full ROM; mild tenderness over the olecranon over the left elbow. On the right elbow, there is significant diffuse tenderness over the olecranon and both epicondyles, with pain on both flexion and extension. SKIN: warm, color normal PSYCH: no  abnormalities of mood noted, alert and oriented to situation   Results for orders placed or performed in visit on 10/09/14  POCT CBC  Result Value Ref Range   WBC 7.9 4.6 - 10.2 K/uL   Lymph, poc 2.1 0.6 - 3.4   POC LYMPH PERCENT 27.2 10 - 50 %L   MID (cbc) 0.5 0 - 0.9   POC MID % 6.0 0 - 12 %M   POC Granulocyte 5.3 2 - 6.9   Granulocyte percent 66.8 37 - 80 %G   RBC 5.31 4.69 - 6.13 M/uL   Hemoglobin 16.8 14.1 - 18.1 g/dL   HCT, POC 51.4 43.5 - 53.7 %   MCV 96.7 80 - 97 fL   MCH, POC 31.7 (A) 27 - 31.2 pg   MCHC 32.7 31.8 - 35.4 g/dL   RDW, POC 13.6 %   Platelet Count, POC 261 142 - 424 K/uL   MPV 6.6 0 - 99.8 fL      Assessment & Plan:  Patient seen with a gout flare in his elbow. He is already on allopurinol. We'll treat with hydrocodone for pain relief.I personally performed the services described in this documentation, which was scribed in my presence. The recorded information has been reviewed and is accurate.

## 2014-10-09 NOTE — Telephone Encounter (Signed)
This has been called in to the pharmacy- pt had 6 month supply sent in to the pharmacy in December. Changed script to 90 with 1 refill.

## 2014-10-10 ENCOUNTER — Other Ambulatory Visit: Payer: Self-pay

## 2014-10-10 ENCOUNTER — Telehealth: Payer: Self-pay

## 2014-10-10 DIAGNOSIS — M10021 Idiopathic gout, right elbow: Secondary | ICD-10-CM

## 2014-10-10 LAB — RHEUMATOID FACTOR: Rhuematoid fact SerPl-aCnc: <10 IU/mL (ref <=14)

## 2014-10-10 MED ORDER — NAPROXEN SODIUM 550 MG PO TABS: ORAL_TABLET | ORAL | Status: DC

## 2014-10-10 NOTE — Telephone Encounter (Signed)
Pt requesting note for limited use of his arm due to gout(seen yesterday) faxed to his work at [phone]    Best phone for pt is [phone]

## 2014-10-10 NOTE — Telephone Encounter (Signed)
Letter has been written and faxed as requested.

## 2014-10-11 LAB — ANA: ANA: NEGATIVE

## 2014-10-13 ENCOUNTER — Telehealth: Payer: Self-pay

## 2014-10-13 ENCOUNTER — Other Ambulatory Visit: Payer: Self-pay | Admitting: Physician Assistant

## 2014-10-13 NOTE — Telephone Encounter (Signed)
Patient states the he has an appointment on Feb 11th and would like a medication refill for hydrocodone. Patient phone: [phone]

## 2014-10-14 ENCOUNTER — Other Ambulatory Visit: Payer: Self-pay | Admitting: Emergency Medicine

## 2014-10-14 MED ORDER — HYDROCODONE-ACETAMINOPHEN 7.5-325 MG PO TABS: 1.0000 | ORAL_TABLET | Freq: Four times a day (QID) | ORAL | Status: DC | PRN

## 2014-10-14 NOTE — Telephone Encounter (Signed)
Notified pt Rx ready.

## 2014-10-18 ENCOUNTER — Telehealth: Payer: Self-pay

## 2014-10-18 ENCOUNTER — Other Ambulatory Visit: Payer: Self-pay | Admitting: Emergency Medicine

## 2014-10-18 DIAGNOSIS — M10021 Idiopathic gout, right elbow: Secondary | ICD-10-CM

## 2014-10-18 MED ORDER — HYDROCODONE-ACETAMINOPHEN 5-325 MG PO TABS: 1.0000 | ORAL_TABLET | Freq: Four times a day (QID) | ORAL | Status: DC | PRN

## 2014-10-18 NOTE — Telephone Encounter (Signed)
Pt in need of his HYDROCODONE 7.5-325 MGS, please call [phone] when ready for pick up

## 2014-10-19 ENCOUNTER — Telehealth: Payer: Self-pay

## 2014-10-19 NOTE — Telephone Encounter (Signed)
LMVM patient's RX is ready for pickup at the front desk at 104 appointment center.

## 2014-10-24 NOTE — Telephone Encounter (Signed)
Pt in need of his HYDROCODONE 7.7-325 MGS, states he is down to his last. Please call [phone]

## 2014-10-25 ENCOUNTER — Other Ambulatory Visit: Payer: Self-pay | Admitting: Physician Assistant

## 2014-10-25 NOTE — Telephone Encounter (Signed)
Judson Roch, it looks like you have filled this for pt the most in EP[facility], but may need to go to Dr Everlene Farrier?

## 2014-10-25 NOTE — Telephone Encounter (Signed)
It is due to be filled on 2/21 - it is to early.  Also time for an OV.

## 2014-10-26 ENCOUNTER — Other Ambulatory Visit: Payer: Self-pay

## 2014-10-26 MED ORDER — HYDROCODONE-ACETAMINOPHEN 7.5-325 MG PO TABS: 1.0000 | ORAL_TABLET | Freq: Four times a day (QID) | ORAL | Status: DC | PRN

## 2014-10-26 NOTE — Telephone Encounter (Signed)
Dr Everlene Farrier, pt is asking for the 7.5-325mg , so I pended that one, but see that 5-325 has also been Rxd, and is what you gave him on 10/18/14.

## 2014-10-26 NOTE — Telephone Encounter (Signed)
Pt called wanting a refill on his HYDROcodone-acetaminophen (NORCO) 7.5-325 MG per tablet [[id number]]. CB#[id number]

## 2014-10-27 NOTE — Telephone Encounter (Signed)
Left message on machine rx ready for pick up

## 2014-10-28 NOTE — Telephone Encounter (Signed)
Pt p/up.

## 2014-10-30 ENCOUNTER — Other Ambulatory Visit: Payer: Self-pay | Admitting: Physician Assistant

## 2014-11-01 ENCOUNTER — Other Ambulatory Visit: Payer: Self-pay

## 2014-11-01 NOTE — Telephone Encounter (Signed)
Pt would like a refill on HYDROcodone-acetaminophen (NORCO) 7.5-325 MG per tablet [[id number]]. Please advise at [phone]

## 2014-11-01 NOTE — Telephone Encounter (Signed)
rx called into pharmacy

## 2014-11-04 ENCOUNTER — Telehealth: Payer: Self-pay

## 2014-11-04 MED ORDER — HYDROCODONE-ACETAMINOPHEN 7.5-325 MG PO TABS: 1.0000 | ORAL_TABLET | Freq: Four times a day (QID) | ORAL | Status: DC | PRN

## 2014-11-04 NOTE — Telephone Encounter (Signed)
Patient would like for Dr. Everlene Farrier to know that he had to rescheduel his referral appointment.

## 2014-11-04 NOTE — Telephone Encounter (Signed)
Dr Daub, FYI 

## 2014-11-04 NOTE — Telephone Encounter (Signed)
Notified pt Rx ready.

## 2014-11-16 ENCOUNTER — Other Ambulatory Visit: Payer: Self-pay | Admitting: Emergency Medicine

## 2014-11-17 NOTE — Telephone Encounter (Signed)
Dr Everlene Farrier, it appears that pt is using this at max dose Rxd  (1 tab BID) and it is only lasting 15 days. Pharm requesting  Full months' RF of 60. I have pended w/new # of 60 instead of #30 on orig Rx.

## 2014-11-18 NOTE — Telephone Encounter (Signed)
Called in.

## 2014-11-21 ENCOUNTER — Telehealth: Payer: Self-pay

## 2014-11-21 DIAGNOSIS — M10021 Idiopathic gout, right elbow: Secondary | ICD-10-CM

## 2014-11-21 MED ORDER — HYDROCODONE-ACETAMINOPHEN 5-325 MG PO TABS: 1.0000 | ORAL_TABLET | Freq: Four times a day (QID) | ORAL | Status: DC | PRN

## 2014-11-21 NOTE — Telephone Encounter (Signed)
Pt states he can't come by due to his job and having to work double shifts, would like to have enough HYDROCODONE 5-325 mgs until the 14th when he is able to come in Please call pt at [phone]

## 2014-11-21 NOTE — Telephone Encounter (Signed)
Okay to print out a refill only to last until the 14th. I will sign it if you put it in my box.

## 2014-11-21 NOTE — Telephone Encounter (Signed)
Rx in your box.

## 2014-11-26 ENCOUNTER — Telehealth: Payer: Self-pay

## 2014-11-26 NOTE — Telephone Encounter (Signed)
Patient has already picked up this medication and turned it in to the pharmacy. Per patient his wife had picked it up and turned it in to the pharmacy. He didn't realize it was Hydrocodone 5-325 until she brought it home. He stated this strength does not help him and he has been getting 7.5-325 mg. Patient is requesting a new prescription to be written for him to pick up for Hydrocodone 7.5-325. He stated he would bring in to our office the 5-325 he has had filled. Per patient he took a couple of them because he was having pain. His call back number is [phone].

## 2014-11-26 NOTE — Telephone Encounter (Signed)
error 

## 2014-11-28 ENCOUNTER — Other Ambulatory Visit: Payer: Self-pay | Admitting: Physician Assistant

## 2014-11-28 NOTE — Telephone Encounter (Signed)
Pt could try 1.5 pills.  It will be a little more tylenol than with only a pill but it will be the same amount of hydrocodone.  That way he will not waste the money that he spent.

## 2014-11-28 NOTE — Telephone Encounter (Signed)
Please advise 

## 2014-11-28 NOTE — Telephone Encounter (Signed)
Patient called again concerning his Hydrocodone. Please see previous phone messages about this. Patient has an appointment with rheumatologist on Thursday  [phone]

## 2014-11-29 ENCOUNTER — Other Ambulatory Visit: Payer: Self-pay | Admitting: Emergency Medicine

## 2014-11-29 DIAGNOSIS — M10021 Idiopathic gout, right elbow: Secondary | ICD-10-CM

## 2014-11-29 DIAGNOSIS — M109 Gout, unspecified: Secondary | ICD-10-CM

## 2014-11-29 MED ORDER — HYDROCODONE-ACETAMINOPHEN 5-325 MG PO TABS: 1.0000 | ORAL_TABLET | Freq: Four times a day (QID) | ORAL | Status: DC | PRN

## 2014-11-29 NOTE — Telephone Encounter (Signed)
Called pt to let him know.

## 2014-11-29 NOTE — Telephone Encounter (Signed)
Spoke with pt, advised message from Blake Foley. Pt has an appt with a rheumatologist on Thursday. He is requesting a refill on the Hydrocodone, he only has one left. Can we give him enough until his appt? Dr. Everlene Farrier can you review?

## 2014-11-29 NOTE — Telephone Encounter (Signed)
Yes he can have one refill but this will be the last refill until he is evaluated.

## 2014-11-29 NOTE — Telephone Encounter (Signed)
Should I print an Rx for 7-325 or 5-325?

## 2014-11-29 NOTE — Telephone Encounter (Signed)
I printed the lower dose down here and we will have him pick it up at 104

## 2014-12-09 ENCOUNTER — Other Ambulatory Visit: Payer: Self-pay

## 2014-12-09 NOTE — Telephone Encounter (Signed)
Pt would like a refill on HYDROcodone-acetaminophen (NORCO/VICODIN) 5-325 MG per tablet [[id number]]. He needs this to hold him until his Thursday referral app. Please advise at [phone]

## 2014-12-11 NOTE — Telephone Encounter (Signed)
Please print out the prescription and I will sign it tomorrow. Future prescriptions will need to come from the specialist

## 2014-12-11 NOTE — Telephone Encounter (Signed)
5-325 or 7.5-325? And 21 tabs?

## 2014-12-12 ENCOUNTER — Other Ambulatory Visit: Payer: Self-pay | Admitting: Emergency Medicine

## 2014-12-12 DIAGNOSIS — M109 Gout, unspecified: Secondary | ICD-10-CM

## 2014-12-12 DIAGNOSIS — M10021 Idiopathic gout, right elbow: Secondary | ICD-10-CM

## 2014-12-12 MED ORDER — HYDROCODONE-ACETAMINOPHEN 5-325 MG PO TABS: 1.0000 | ORAL_TABLET | Freq: Four times a day (QID) | ORAL | Status: DC | PRN

## 2014-12-12 NOTE — Telephone Encounter (Signed)
I printed out the 325/5  and will have them at the desk for him today

## 2014-12-12 NOTE — Telephone Encounter (Signed)
Rx in pick up draw. 

## 2014-12-12 NOTE — Telephone Encounter (Signed)
Called pt to let him know Rx ready to pick. Left message.

## 2014-12-16 ENCOUNTER — Other Ambulatory Visit: Payer: Self-pay | Admitting: Emergency Medicine

## 2014-12-17 ENCOUNTER — Other Ambulatory Visit: Payer: Self-pay | Admitting: Emergency Medicine

## 2014-12-18 ENCOUNTER — Other Ambulatory Visit: Payer: Self-pay | Admitting: Emergency Medicine

## 2014-12-18 DIAGNOSIS — M10021 Idiopathic gout, right elbow: Secondary | ICD-10-CM

## 2014-12-18 DIAGNOSIS — M109 Gout, unspecified: Secondary | ICD-10-CM

## 2014-12-18 NOTE — Telephone Encounter (Signed)
Refill on Hydrocodone  [phone]

## 2014-12-19 MED ORDER — HYDROCODONE-ACETAMINOPHEN 5-325 MG PO TABS: 1.0000 | ORAL_TABLET | Freq: Four times a day (QID) | ORAL | Status: DC | PRN

## 2014-12-19 NOTE — Telephone Encounter (Signed)
Rx for Hydrocodone in the box to pick up. Called pt, Left message on VM.

## 2014-12-19 NOTE — Telephone Encounter (Signed)
I called pt bc on last Rf of this Dr Everlene Farrier wrote a note saying that he was only writing it until specialist appt which was scheduled for the Thurs after Rx date and then specialist would need to start [place]. Pt reported that he missed his appt that day d/t change of position at job, and he just forgot it. He has another appt sch w/specialist this Friday and has taken a day off work so he is sure to not miss it. Dr Everlene Farrier, do you want to RF until then? Pended the 5-325 mg, as last Rxd.

## 2014-12-19 NOTE — Telephone Encounter (Signed)
Called in.

## 2014-12-21 NOTE — Telephone Encounter (Signed)
See other message, this has been done.

## 2015-01-09 ENCOUNTER — Other Ambulatory Visit: Payer: Self-pay | Admitting: Emergency Medicine

## 2015-01-10 NOTE — Telephone Encounter (Signed)
Dr Everlene Farrier, you saw pt in Jan for other issues, but don't see asthma discussed recently. OK to give RFs?

## 2015-01-12 ENCOUNTER — Other Ambulatory Visit: Payer: Self-pay | Admitting: Emergency Medicine

## 2015-02-15 ENCOUNTER — Telehealth: Payer: Self-pay

## 2015-02-15 NOTE — Telephone Encounter (Signed)
Pt states he is in need of his XANAX 1 mg. Please call [phone] when ready

## 2015-02-16 NOTE — Telephone Encounter (Signed)
Spoke with pt, advised message from Dr. Everlene Farrier. Pt understood and will return to clinic.

## 2015-02-16 NOTE — Telephone Encounter (Signed)
Patient needs to return to clinic for me to discuss the use of his benzos. I work Friday Saturday and Sunday and will be happy to discuss this with him

## 2015-06-02 ENCOUNTER — Other Ambulatory Visit: Payer: Self-pay | Admitting: Family Medicine

## 2015-06-20 ENCOUNTER — Encounter: Payer: Self-pay | Admitting: Emergency Medicine

## 2015-07-17 ENCOUNTER — Other Ambulatory Visit: Payer: Self-pay | Admitting: Emergency Medicine

## 2015-08-04 ENCOUNTER — Encounter: Payer: BLUE CROSS/BLUE SHIELD | Admitting: Physician Assistant

## 2015-08-17 ENCOUNTER — Other Ambulatory Visit: Payer: Self-pay | Admitting: Emergency Medicine

## 2015-09-25 ENCOUNTER — Other Ambulatory Visit: Payer: Self-pay | Admitting: Emergency Medicine

## 2015-09-28 ENCOUNTER — Telehealth: Payer: Self-pay

## 2015-09-28 MED ORDER — LISINOPRIL 10 MG PO TABS: 10.0000 mg | ORAL_TABLET | Freq: Every day | ORAL | Status: DC

## 2015-09-28 MED ORDER — ALBUTEROL SULFATE HFA 108 (90 BASE) MCG/ACT IN AERS: INHALATION_SPRAY | RESPIRATORY_TRACT | Status: DC

## 2015-09-28 NOTE — Telephone Encounter (Signed)
Needs a refill on is pro air and lisinopril   Best number [phone]

## 2015-09-28 NOTE — Telephone Encounter (Signed)
Pt has an appt with Judson Roch. Refilled until then.

## 2015-11-25 ENCOUNTER — Other Ambulatory Visit: Payer: Self-pay | Admitting: Emergency Medicine

## 2015-11-27 NOTE — Telephone Encounter (Signed)
Please forward this to Judson Roch. She was supposed to have an appointment to see her for follow-up. It is okay to give one refill but we need to make sure she is following up with Windell Hummingbird.

## 2015-11-30 ENCOUNTER — Encounter: Payer: BLUE CROSS/BLUE SHIELD | Admitting: Physician Assistant

## 2015-12-19 ENCOUNTER — Telehealth: Payer: Self-pay

## 2015-12-19 MED ORDER — LISINOPRIL 10 MG PO TABS: 10.0000 mg | ORAL_TABLET | Freq: Every day | ORAL | Status: DC

## 2015-12-19 NOTE — Telephone Encounter (Signed)
Ok to refill Judson Roch?

## 2015-12-19 NOTE — Telephone Encounter (Signed)
Done - I will need to see him before these run out

## 2015-12-19 NOTE — Telephone Encounter (Signed)
Patient ha an appointment for medication refill and it was cancelled. He's trying to get a refill for lisinopril and isn't able to see Clarise Cruz during her walk in hours because she won't be here this weekend. Please advise!  [phone]

## 2015-12-20 NOTE — Telephone Encounter (Signed)
Blake Foley

## 2015-12-20 NOTE — Telephone Encounter (Signed)
Patient request for a 90 day supply. He is not able to see Sarah by appointment at the moment due his work schedule. Patient is out of medication since yesterday. [facility] [address] [phone].

## 2015-12-20 NOTE — Telephone Encounter (Signed)
Pt advised. He states his original appt was cancelled and his schedule does not permit for him to come in the weekends and he really does want to see you. He is requesting a 90 day supply.

## 2015-12-21 MED ORDER — LISINOPRIL 10 MG PO TABS: 10.0000 mg | ORAL_TABLET | Freq: Every day | ORAL | Status: DC

## 2015-12-25 ENCOUNTER — Other Ambulatory Visit: Payer: Self-pay | Admitting: Emergency Medicine

## 2015-12-26 NOTE — Telephone Encounter (Signed)
Faxed

## 2016-01-18 ENCOUNTER — Encounter (HOSPITAL_COMMUNITY): Payer: Self-pay | Admitting: *Deleted

## 2016-01-18 ENCOUNTER — Emergency Department (HOSPITAL_COMMUNITY)
Admission: EM | Admit: 2016-01-18 | Discharge: 2016-01-18 | Disposition: A | Discharge status: Home / Self Care | Payer: BLUE CROSS/BLUE SHIELD | Attending: Emergency Medicine | Admitting: Emergency Medicine

## 2016-01-18 ENCOUNTER — Emergency Department (HOSPITAL_COMMUNITY): Payer: BLUE CROSS/BLUE SHIELD

## 2016-01-18 DIAGNOSIS — F17210 Nicotine dependence, cigarettes, uncomplicated: Secondary | ICD-10-CM | POA: Diagnosis not present

## 2016-01-18 DIAGNOSIS — I10 Essential (primary) hypertension: Secondary | ICD-10-CM | POA: Diagnosis not present

## 2016-01-18 DIAGNOSIS — E663 Overweight: Secondary | ICD-10-CM | POA: Insufficient documentation

## 2016-01-18 DIAGNOSIS — J441 Chronic obstructive pulmonary disease with (acute) exacerbation: Secondary | ICD-10-CM | POA: Insufficient documentation

## 2016-01-18 DIAGNOSIS — Z79899 Other long term (current) drug therapy: Secondary | ICD-10-CM | POA: Diagnosis not present

## 2016-01-18 DIAGNOSIS — R0602 Shortness of breath: Secondary | ICD-10-CM | POA: Diagnosis present

## 2016-01-18 HISTORY — DX: Essential (primary) hypertension: I10

## 2016-01-18 LAB — CBC
HEMATOCRIT: 42.7 % (ref 39.0–52.0)
Hemoglobin: 14.9 g/dL (ref 13.0–17.0)
MCH: 33.7 pg (ref 26.0–34.0)
MCHC: 34.9 g/dL (ref 30.0–36.0)
MCV: 96.6 fL (ref 78.0–100.0)
Platelets: 195 10*3/uL (ref 150–400)
RBC: 4.42 MIL/uL (ref 4.22–5.81)
RDW: 12.3 % (ref 11.5–15.5)
WBC: 5.2 10*3/uL (ref 4.0–10.5)

## 2016-01-18 LAB — PROTIME-INR
INR: 1.14 (ref 0.00–1.49)
PROTHROMBIN TIME: 14.8 s (ref 11.6–15.2)

## 2016-01-18 LAB — BASIC METABOLIC PANEL
Anion gap: 14 (ref 5–15)
BUN: 11 mg/dL (ref 6–20)
CO2: 20 mmol/L — AB (ref 22–32)
Calcium: 8.9 mg/dL (ref 8.9–10.3)
Chloride: 99 mmol/L — ABNORMAL LOW (ref 101–111)
Creatinine, Ser: 0.64 mg/dL (ref 0.61–1.24)
GFR calc Af Amer: >60 mL/min (ref >60)
GLUCOSE: 115 mg/dL — AB (ref 65–99)
POTASSIUM: 4.1 mmol/L (ref 3.5–5.1)
Sodium: 133 mmol/L — ABNORMAL LOW (ref 135–145)

## 2016-01-18 LAB — I-STAT TROPONIN, ED: Troponin i, poc: 0 ng/mL (ref 0.00–0.08)

## 2016-01-18 MED ORDER — PREDNISONE 50 MG PO TABS: 50.0000 mg | ORAL_TABLET | Freq: Every day | ORAL | Status: DC

## 2016-01-18 MED ORDER — PREDNISONE 20 MG PO TABS
60.0000 mg | ORAL_TABLET | Freq: Once | ORAL | Status: AC
  Administered 2016-01-18: 60 mg via ORAL
  Filled 2016-01-18: qty 3

## 2016-01-18 MED ORDER — IPRATROPIUM-ALBUTEROL 0.5-2.5 (3) MG/3ML IN SOLN
3.0000 mL | Freq: Once | RESPIRATORY_TRACT | Status: AC
  Administered 2016-01-18: 3 mL via RESPIRATORY_TRACT
  Filled 2016-01-18: qty 3

## 2016-01-18 NOTE — Discharge Instructions (Signed)

## 2016-01-18 NOTE — ED Provider Notes (Signed)
CSN: [id number]     Arrival date & time 01/18/16  0710 History   First MD Initiated Contact with Patient 01/18/16 [phone]     Chief Complaint  Patient presents with  . Shortness of Breath  . Fatigue   HPI Patient presents to the emergency room with complaints of generalized fatigue and some shortness of breath that started about 3 days ago. Initially thought he was coming down with something like the flu. He had a bit of a headache as well as some generalized malaise. Patient's a smoker. He has mild COPD. He has been using his inhaler frequently over the last few days. He has had a dry cough. Today at work he started feeling more fatigued and short of breath. He denies any chest pain. Denies any diaphoresis. No vomiting or diarrhea. No known fevers.. Past Medical History  Diagnosis Date  . COPD (chronic obstructive pulmonary disease) ([place])   . Hypertension    Past Surgical History  Procedure Laterality Date  . Knee surgery     Family History  Problem Relation Age of Onset  . Stroke Father    Social History  Substance Use Topics  . Smoking status: Current Every Day Smoker -- 0.50 packs/day for 30 years    Types: Cigarettes  . Smokeless tobacco: None  . Alcohol Use: 1.8 oz/week    3 Cans of beer per week    Review of Systems  All other systems reviewed and are negative.     Allergies  Review of patient's allergies indicates no known allergies.  Home Medications   Prior to Admission medications   Medication Sig Start Date End Date Taking? Authorizing Provider  albuterol (PROAIR HFA) 108 (90 Base) MCG/ACT inhaler INHALE 1 PUFF EVERY 4 HOURS AS NEEDED, NEEDS OFFICE VISIT 09/28/15  Yes Darlyne Russian, MD  allopurinol (ZYLOPRIM) 300 MG tablet Take 450 mg by mouth daily.   Yes Historical Provider, MD  ALPRAZolam Duanne Moron) 1 MG tablet TAKE 1/2 TO 1 TABLET BY MOUTH TWICE DAILY AS NEEDED 12/26/15  Yes Darlyne Russian, MD  colchicine 0.6 MG tablet Take 1 tablet 3 times a day 10/09/14  Yes  Darlyne Russian, MD  GARLIC PO Take by mouth.   Yes Historical Provider, MD  HYDROcodone-acetaminophen (NORCO) 7.5-325 MG tablet Take 1 tablet by mouth every 6 (six) hours as needed for moderate pain.   Yes Historical Provider, MD  ibuprofen (ADVIL,MOTRIN) 200 MG tablet Take 200 mg by mouth every 6 (six) hours as needed.   Yes Historical Provider, MD  lisinopril (PRINIVIL,ZESTRIL) 10 MG tablet Take 1 tablet (10 mg total) by mouth daily. NO MORE REFILLS WITHOUT OFFICE VISIT - 2ND NOTICE 12/19/15  Yes Mancel Bale, PA-C  Multiple Vitamin (MULTIVITAMIN) capsule Take 1 capsule by mouth daily.   Yes Historical Provider, MD  naproxen sodium (ANAPROX) 550 MG tablet Take 1 tablet by mouth twice daily as needed for pain 10/10/14  Yes Darlyne Russian, MD  traMADol (ULTRAM) 50 MG tablet Take by mouth every 6 (six) hours as needed.   Yes Historical Provider, MD  allopurinol (ZYLOPRIM) 100 MG tablet Take 1 tablet (100 mg total) by mouth daily. 10/09/14   Darreld Mclean, MD  HYDROcodone-acetaminophen (NORCO/VICODIN) 5-325 MG per tablet Take 1-2 tablets by mouth every 6 (six) hours as needed for moderate pain or severe pain. 12/19/14   Tereasa Coop, PA-C  lisinopril (PRINIVIL,ZESTRIL) 10 MG tablet Take 1 tablet (10 mg total) by mouth daily. NO  MORE REFILLS WITHOUT OFFICE VISIT - 2ND NOTICE 12/21/15   Mancel Bale, PA-C  predniSONE (DELTASONE) 50 MG tablet Take 1 tablet (50 mg total) by mouth daily. 01/18/16   Dorie Rank, MD   BP 146/81 mmHg  Pulse 75  Temp(Src) 97.4 F (36.3 C) (Oral)  Resp 14  SpO2 97% Physical Exam  Constitutional: No distress.  Overweight  HENT:  Head: Normocephalic and atraumatic.  Right Ear: External ear normal.  Left Ear: External ear normal.  Eyes: Conjunctivae are normal. Right eye exhibits no discharge. Left eye exhibits no discharge. No scleral icterus.  Neck: Neck supple. No tracheal deviation present.  Cardiovascular: Normal rate, regular rhythm and intact distal pulses.    Pulmonary/Chest: Effort normal. No stridor. No respiratory distress. He has wheezes (few wheezes on end expiration primarily on the left side). He has no rales.  Speaking in full sentences  Abdominal: Soft. Bowel sounds are normal. He exhibits no distension. There is no tenderness. There is no rebound and no guarding.  Musculoskeletal: He exhibits no edema or tenderness.  Neurological: He is alert. He has normal strength. No cranial nerve deficit (no facial droop, extraocular movements intact, no slurred speech) or sensory deficit. He exhibits normal muscle tone. He displays no seizure activity. Coordination normal.  Skin: Skin is warm and dry. No rash noted.  Psychiatric: He has a normal mood and affect.  Nursing note and vitals reviewed.   ED Course  Procedures (including critical care time) Labs Review Labs Reviewed  BASIC METABOLIC PANEL - Abnormal; Notable for the following:    Sodium 133 (*)    Chloride 99 (*)    CO2 20 (*)    Glucose, Bld 115 (*)    All other components within normal limits  CBC  PROTIME-INR  Randolm Idol, ED    Imaging Review Dg Chest 2 View  01/18/2016  CLINICAL DATA:  Short of breath.  Chest discomfort. EXAM: CHEST  2 VIEW COMPARISON:  09/29/2012 FINDINGS: Normal heart size. Lungs clear. No pneumothorax. No pleural effusion. IMPRESSION: No active cardiopulmonary disease. Electronically Signed   By: Marybelle Killings M.D.   On: 01/18/2016 08:33   I have personally reviewed and evaluated these images and lab results as part of my medical decision-making.   EKG Interpretation   Date/Time:  Thursday Jan 18 2016 07:23:08 EDT Ventricular Rate:  71 PR Interval:  127 QRS Duration: 105 QT Interval:  445 QTC Calculation: 484 R Axis:   87 Text Interpretation:  Sinus rhythm Borderline prolonged QT interval No old  tracing to compare Confirmed by Marlene Pfluger  MD-J, Leiyah Maultsby [id number]) on 01/18/2016  7:32:32 AM      MDM   Final diagnoses:  COPD exacerbation ([place])     Patient presented to emergency with complaints of shortness of breath and fatigue. Patient was treated with a DuoNeb treatment.  His symptoms improved.  I suspect his treatments are related to COPD exacerbation. I doubt pneumonia or cardiac etiology. Stressed smoking cessation. Discharge home on a course of oral steroids. Follow up with his primary doctor.     Dorie Rank, MD 01/18/16 1009

## 2016-01-18 NOTE — ED Notes (Signed)
Pt c/o SOB and weakness that began about 3 days ago. Pt c/o intermittent cough and states that he just does not fell well.

## 2016-01-26 ENCOUNTER — Other Ambulatory Visit: Payer: Self-pay | Admitting: Emergency Medicine

## 2016-01-29 NOTE — Telephone Encounter (Signed)
Pt has not been seen in over a year. We cancelled an appt pt had in March to see Judson Roch. He stated on last RF req that his job doesn't allow him to come in during walk in hours. Please advise.

## 2016-01-29 NOTE — Telephone Encounter (Signed)
Please call patient this is his last refill. There will be no more refills until he comes and discusses his situation with Judson Roch. If He must work during the week he can calm on a Saturday.

## 2016-01-31 ENCOUNTER — Ambulatory Visit (INDEPENDENT_AMBULATORY_CARE_PROVIDER_SITE_OTHER): Payer: BLUE CROSS/BLUE SHIELD

## 2016-01-31 ENCOUNTER — Ambulatory Visit
Admission: RE | Admit: 2016-01-31 | Discharge: 2016-01-31 | Disposition: A | Discharge status: Home / Self Care | Payer: BLUE CROSS/BLUE SHIELD | Source: Ambulatory Visit | Attending: Emergency Medicine | Admitting: Emergency Medicine

## 2016-01-31 ENCOUNTER — Other Ambulatory Visit: Payer: Self-pay | Admitting: Emergency Medicine

## 2016-01-31 ENCOUNTER — Ambulatory Visit (INDEPENDENT_AMBULATORY_CARE_PROVIDER_SITE_OTHER): Payer: BLUE CROSS/BLUE SHIELD | Admitting: Emergency Medicine

## 2016-01-31 VITALS — BP 118/70 | HR 104 | Temp 97.6°F | Resp 16 | Ht 71.0 in | Wt 209.4 lb

## 2016-01-31 DIAGNOSIS — R1900 Intra-abdominal and pelvic swelling, mass and lump, unspecified site: Secondary | ICD-10-CM

## 2016-01-31 DIAGNOSIS — Z1211 Encounter for screening for malignant neoplasm of colon: Secondary | ICD-10-CM | POA: Diagnosis not present

## 2016-01-31 DIAGNOSIS — F1010 Alcohol abuse, uncomplicated: Secondary | ICD-10-CM

## 2016-01-31 DIAGNOSIS — I10 Essential (primary) hypertension: Secondary | ICD-10-CM | POA: Diagnosis not present

## 2016-01-31 DIAGNOSIS — R29898 Other symptoms and signs involving the musculoskeletal system: Secondary | ICD-10-CM

## 2016-01-31 LAB — POCT CBC
GRANULOCYTE PERCENT: 79.7 % (ref 37–80)
HCT, POC: 41.8 % — AB (ref 43.5–53.7)
HEMOGLOBIN: 15.6 g/dL (ref 14.1–18.1)
Lymph, poc: 1.4 (ref 0.6–3.4)
MCH: 34.9 pg — AB (ref 27–31.2)
MCHC: 37.2 g/dL — AB (ref 31.8–35.4)
MCV: 93.6 fL (ref 80–97)
MID (cbc): 0.4 (ref 0–0.9)
MPV: 6.7 fL (ref 0–99.8)
POC GRANULOCYTE: 7.4 — AB (ref 2–6.9)
POC LYMPH PERCENT: 15.5 %L (ref 10–50)
POC MID %: 4.8 % (ref 0–12)
Platelet Count, POC: 154 10*3/uL (ref 142–424)
RBC: 4.46 M/uL — AB (ref 4.69–6.13)
RDW, POC: 12.8 %
WBC: 9.3 10*3/uL (ref 4.6–10.2)

## 2016-01-31 LAB — FERRITIN: FERRITIN: 2820 ng/mL — AB (ref 20–380)

## 2016-01-31 LAB — HEPATIC FUNCTION PANEL
ALBUMIN: 4.1 g/dL (ref 3.6–5.1)
ALT: 162 U/L — ABNORMAL HIGH (ref 9–46)
AST: 379 U/L — ABNORMAL HIGH (ref 10–35)
Alkaline Phosphatase: 220 U/L — ABNORMAL HIGH (ref 40–115)
BILIRUBIN DIRECT: 1.1 mg/dL — AB (ref <=0.2)
BILIRUBIN TOTAL: 2.3 mg/dL — AB (ref 0.2–1.2)
Indirect Bilirubin: 1.2 mg/dL (ref 0.2–1.2)
Total Protein: 7 g/dL (ref 6.1–8.1)

## 2016-01-31 LAB — POCT URINALYSIS DIP (MANUAL ENTRY)
Glucose, UA: NEGATIVE
Leukocytes, UA: NEGATIVE
NITRITE UA: POSITIVE — AB
PH UA: 5.5
Protein Ur, POC: 100 — AB
SPEC GRAV UA: 1.025
Urobilinogen, UA: 2

## 2016-01-31 LAB — HEPATITIS B SURFACE ANTIGEN: Hepatitis B Surface Ag: NEGATIVE

## 2016-01-31 LAB — POC MICROSCOPIC URINALYSIS (UMFC)

## 2016-01-31 LAB — VITAMIN B12: Vitamin B-12: 967 pg/mL (ref 200–1100)

## 2016-01-31 LAB — MAGNESIUM: MAGNESIUM: 1.7 mg/dL (ref 1.5–2.5)

## 2016-01-31 LAB — HEPATITIS C ANTIBODY: HCV AB: NEGATIVE

## 2016-01-31 LAB — TSH: TSH: 0.88 mIU/L (ref 0.40–4.50)

## 2016-01-31 MED ORDER — ALPRAZOLAM 1 MG PO TABS: ORAL_TABLET | ORAL | Status: DC

## 2016-01-31 MED ORDER — IOPAMIDOL (ISOVUE-300) INJECTION 61%
125.0000 mL | Freq: Once | INTRAVENOUS | Status: AC | PRN
  Administered 2016-01-31: 125 mL via INTRAVENOUS

## 2016-01-31 NOTE — Progress Notes (Signed)
By signing my name below, I, Blake Foley, attest that this documentation has been prepared under the direction and in the presence of Blake Queen, MD. Electronically Signed: Moises Foley, [place]. 01/31/2016 , 8:20 AM .  Patient was seen in room 3 .  Chief Complaint:  Chief Complaint  Patient presents with  . Follow-up    post hospital visit  . leg problem    both legs feel pain in them and have a heavy feeling in both  . abdominal knot    on the lower right side    HPI: Blake Foley is a 55 y.o. male who reports to [facility] today for hospital follow up. Patient was sent to the ED after feeling very weak at work. He reports having xrays and labs done, which were normal. He also had a breathing treatment, but unsure if ht helped improve his symptoms.   Patient informs still having bilateral calf pain and heavy feeling, "like he's dragging himself". He also noticed a knot in his RUQ abdomen. He denies any heart issues.   He is still a current smoker, about 5-6 cigarettes a day; switching towards vapor now.  He still drinks about 4-5 cans a night, for a long time.   Past Medical History  Diagnosis Date  . COPD (chronic obstructive pulmonary disease) ([place])   . Hypertension    Past Surgical History  Procedure Laterality Date  . Knee surgery     Social History   Social History  . Marital Status: Married    Spouse Name: N/A  . Number of Children: N/A  . Years of Education: N/A   Social History Main Topics  . Smoking status: Current Every Day Smoker -- 0.50 packs/day for 30 years    Types: Cigarettes  . Smokeless tobacco: None  . Alcohol Use: 1.8 oz/week    3 Cans of beer per week  . Drug Use: No  . Sexual Activity: No   Other Topics Concern  . None   Social History Narrative   Family History  Problem Relation Age of Onset  . Stroke Father    No Known Allergies Prior to Admission medications   Medication Sig Start Date End Date Taking? Authorizing  Provider  albuterol (PROAIR HFA) 108 (90 Base) MCG/ACT inhaler INHALE 1 PUFF EVERY 4 HOURS AS NEEDED, NEEDS OFFICE VISIT 09/28/15  Yes Darlyne Russian, MD  allopurinol (ZYLOPRIM) 300 MG tablet Take 450 mg by mouth daily.   Yes Historical Provider, MD  ALPRAZolam Duanne Moron) 1 MG tablet TAKE 1/2 TO 1 TABLET BY MOUTH TWICE DAILY AS NEEDED 01/30/16  Yes Darlyne Russian, MD  colchicine 0.6 MG tablet Take 1 tablet 3 times a day 10/09/14  Yes Darlyne Russian, MD  GARLIC PO Take by mouth.   Yes Historical Provider, MD  HYDROcodone-acetaminophen (NORCO) 7.5-325 MG tablet Take 1 tablet by mouth every 6 (six) hours as needed for moderate pain.   Yes Historical Provider, MD  lisinopril (PRINIVIL,ZESTRIL) 10 MG tablet Take 1 tablet (10 mg total) by mouth daily. NO MORE REFILLS WITHOUT OFFICE VISIT - 2ND NOTICE 12/19/15  Yes Mancel Bale, PA-C  lisinopril (PRINIVIL,ZESTRIL) 10 MG tablet Take 1 tablet (10 mg total) by mouth daily. NO MORE REFILLS WITHOUT OFFICE VISIT - 2ND NOTICE 12/21/15  Yes Mancel Bale, PA-C  Multiple Vitamin (MULTIVITAMIN) capsule Take 1 capsule by mouth daily.   Yes Historical Provider, MD  allopurinol (ZYLOPRIM) 100 MG tablet Take 1 tablet (100  mg total) by mouth daily. Patient not taking: Reported on 01/31/2016 10/09/14   Darreld Mclean, MD  HYDROcodone-acetaminophen (NORCO/VICODIN) 5-325 MG per tablet Take 1-2 tablets by mouth every 6 (six) hours as needed for moderate pain or severe pain. Patient not taking: Reported on 01/31/2016 12/19/14   Tereasa Coop, PA-C  ibuprofen (ADVIL,MOTRIN) 200 MG tablet Take 200 mg by mouth every 6 (six) hours as needed. Reported on 01/31/2016    Historical Provider, MD  naproxen sodium (ANAPROX) 550 MG tablet Take 1 tablet by mouth twice daily as needed for pain Patient not taking: Reported on 01/31/2016 10/10/14   Darlyne Russian, MD  predniSONE (DELTASONE) 50 MG tablet Take 1 tablet (50 mg total) by mouth daily. Patient not taking: Reported on 01/31/2016 01/18/16   Dorie Rank, MD  traMADol (ULTRAM) 50 MG tablet Take by mouth every 6 (six) hours as needed. Reported on 01/31/2016    Historical Provider, MD     ROS:  Constitutional: negative for fever, chills, night sweats, weight changes, or fatigue  HEENT: negative for vision changes, hearing loss, congestion, rhinorrhea, ST, epistaxis, or sinus pressure Cardiovascular: negative for chest pain or palpitations Respiratory: negative for hemoptysis, wheezing, shortness of breath, or cough Abdominal: negative for abdominal pain, nausea, vomiting, diarrhea, or constipation; positive for abdominal mass Musc: positive for myalgia Dermatological: negative for rash Neurologic: negative for headache, dizziness, or syncope; positive for weakness All other systems reviewed and are otherwise negative with the exception to those above and in the HPI.  PHYSICAL EXAM: Filed Vitals:   01/31/16 0816  BP: 118/70  Pulse: 104  Temp: 97.6 F (36.4 C)  Resp: 16   Body mass index is 29.22 kg/(m^2).   General: Flushed but not in distress HEENT:  Normocephalic, atraumatic, oropharynx patent. Eye: [facility] Cardiovascular:  Tachycardia, Regular rhythm, no rubs murmurs or gallops.  No Carotid bruits, radial pulse intact. No pedal edema.  Respiratory: Clear to auscultation bilaterally.  No wheezes, rales, or rhonchi.  No cyanosis, no use of accessory musculature Abdominal:  there is a palpated mass RUQ, approximately 6 inches below costal margin, there is also a palpated spleen in LUQ Musculoskeletal: Gait intact. No edema, tenderness Skin: No rashes. Neurologic: Facial musculature symmetric. Psychiatric: Patient acts appropriately throughout our interaction.  Lymphatic: No cervical or submandibular lymphadenopathy Genitourinary/Anorectal: No acute findings , chest clear, heart tachy but no murmur, abd LABS: Results for orders placed or performed in visit on 01/31/16  POCT CBC  Result Value Ref Range   WBC 9.3 4.6  - 10.2 K/uL   Lymph, poc 1.4 0.6 - 3.4   POC LYMPH PERCENT 15.5 10 - 50 %L   MID (cbc) 0.4 0 - 0.9   POC MID % 4.8 0 - 12 %M   POC Granulocyte 7.4 (A) 2 - 6.9   Granulocyte percent 79.7 37 - 80 %G   RBC 4.46 (A) 4.69 - 6.13 M/uL   Hemoglobin 15.6 14.1 - 18.1 g/dL   HCT, POC 41.8 (A) 43.5 - 53.7 %   MCV 93.6 80 - 97 fL   MCH, POC 34.9 (A) 27 - 31.2 pg   MCHC 37.2 (A) 31.8 - 35.4 g/dL   RDW, POC 12.8 %   Platelet Count, POC 154 142 - 424 K/uL   MPV 6.7 0 - 99.8 fL     EKG/XRAY:   Dg Abd Acute W/chest  01/31/2016  CLINICAL DATA:  Abdominal mass, smoker, COPD. EXAM: DG ABDOMEN ACUTE W/  1V CHEST COMPARISON:  PA and lateral chest x-ray of Jan 18, 2016 FINDINGS: The lungs are mildly hyperinflated and clear. The heart and pulmonary vascularity are normal in appearance. The mediastinum is normal in width. No lymphadenopathy is observed. The bony thorax is unremarkable. Within the abdomen the bowel gas pattern is normal. No abnormal soft tissue calcifications are observed. The bony structures exhibit no acute abnormalities. IMPRESSION: There is no active cardiopulmonary disease. No acute intra-abdominal abnormality is observed. Electronically Signed   By: David  Martinique M.D.   On: 01/31/2016 08:59    ASSESSMENT/PLAN:  I placed patient on a multivitamin B complex. He is going to the hospital for a CT of his abdomen for a right upper abdominal mass. Unclear what this is. He has had mild liver function test elevation in the past. He has never had a colonoscopy. He is a smoker and heavy drinker. I have scheduled him to see GI colonoscopy and he is here for a stat CT abdomen pelvis. Gross sideeffects, risk and benefits, and alternatives of medications d/w patient. Patient is aware that all medications have potential sideeffects and we are unable to predict every sideeffect or drug-drug interaction that may occur. Of note he also is on allopurinol.  Blake Queen MD 01/31/2016 8:20 AM

## 2016-01-31 NOTE — Patient Instructions (Addendum)
You are to go over to [place] now for your scan.  [place]    IF you received an x-ray today, you will receive an invoice from [facility]. Please contact [facility] at [phone] with questions or concerns regarding your invoice.   IF you received labwork today, you will receive an invoice from [employer]. Please contact [facility] at [phone] with questions or concerns regarding your invoice.   Our billing staff will not be able to assist you with questions regarding bills from these companies.  You will be contacted with the lab results as soon as they are available. The fastest way to get your results is to activate your My Chart account. Instructions are located on the last page of this paperwork. If you have not heard from [place] regarding the results in 2 weeks, please contact this office.

## 2016-02-01 ENCOUNTER — Other Ambulatory Visit: Payer: Self-pay | Admitting: Emergency Medicine

## 2016-02-01 DIAGNOSIS — R160 Hepatomegaly, not elsewhere classified: Secondary | ICD-10-CM

## 2016-02-01 DIAGNOSIS — R7989 Other specified abnormal findings of blood chemistry: Secondary | ICD-10-CM

## 2016-02-01 LAB — AFP TUMOR MARKER: AFP-Tumor Marker: 5.9 ng/mL (ref <6.1)

## 2016-02-01 LAB — ANTI-NUCLEAR AB-TITER (ANA TITER): ANA Titer 1: 1:40 {titer} — ABNORMAL HIGH

## 2016-02-01 LAB — ANA: Anti Nuclear Antibody(ANA): POSITIVE — AB

## 2016-02-02 ENCOUNTER — Other Ambulatory Visit: Payer: Self-pay | Admitting: Emergency Medicine

## 2016-02-02 LAB — MITOCHONDRIAL ANTIBODIES

## 2016-02-03 LAB — IRON AND TIBC
%SAT: 94 % — ABNORMAL HIGH (ref 15–60)
IRON: 161 ug/dL (ref 50–180)
TIBC: 171 ug/dL — AB (ref 250–425)
UIBC: <17 ug/dL — ABNORMAL LOW (ref 125–400)

## 2016-02-04 LAB — VITAMIN B1: VITAMIN B1 (THIAMINE): 8 nmol/L (ref 8–30)

## 2016-02-05 LAB — CERULOPLASMIN: CERULOPLASMIN: 18 mg/dL (ref 18–36)

## 2016-02-05 LAB — ALPHA-1-ANTITRYPSIN: A-1 Antitrypsin, Ser: 180 mg/dL (ref 83–199)

## 2016-02-14 ENCOUNTER — Other Ambulatory Visit: Payer: Self-pay | Admitting: Gastroenterology

## 2016-02-14 DIAGNOSIS — R935 Abnormal findings on diagnostic imaging of other abdominal regions, including retroperitoneum: Secondary | ICD-10-CM

## 2016-02-23 ENCOUNTER — Other Ambulatory Visit: Payer: BLUE CROSS/BLUE SHIELD

## 2016-02-23 ENCOUNTER — Ambulatory Visit: Payer: BLUE CROSS/BLUE SHIELD | Admitting: Hematology & Oncology

## 2016-02-23 ENCOUNTER — Ambulatory Visit: Payer: BLUE CROSS/BLUE SHIELD

## 2016-03-08 ENCOUNTER — Ambulatory Visit
Admission: RE | Admit: 2016-03-08 | Discharge: 2016-03-08 | Disposition: A | Discharge status: Home / Self Care | Payer: BLUE CROSS/BLUE SHIELD | Source: Ambulatory Visit | Attending: Gastroenterology | Admitting: Gastroenterology

## 2016-03-08 DIAGNOSIS — R935 Abnormal findings on diagnostic imaging of other abdominal regions, including retroperitoneum: Secondary | ICD-10-CM

## 2016-03-08 MED ORDER — GADOBENATE DIMEGLUMINE 529 MG/ML IV SOLN
20.0000 mL | Freq: Once | INTRAVENOUS | Status: AC | PRN
  Administered 2016-03-08: 20 mL via INTRAVENOUS

## 2016-03-26 DIAGNOSIS — M7989 Other specified soft tissue disorders: Secondary | ICD-10-CM | POA: Diagnosis not present

## 2016-03-26 DIAGNOSIS — M1A09X1 Idiopathic chronic gout, multiple sites, with tophus (tophi): Secondary | ICD-10-CM | POA: Diagnosis not present

## 2016-03-26 DIAGNOSIS — M79641 Pain in right hand: Secondary | ICD-10-CM | POA: Diagnosis not present

## 2016-03-26 DIAGNOSIS — R21 Rash and other nonspecific skin eruption: Secondary | ICD-10-CM | POA: Diagnosis not present

## 2016-03-26 DIAGNOSIS — M150 Primary generalized (osteo)arthritis: Secondary | ICD-10-CM | POA: Diagnosis not present

## 2016-04-13 ENCOUNTER — Other Ambulatory Visit: Payer: Self-pay | Admitting: Physician Assistant

## 2016-04-25 ENCOUNTER — Other Ambulatory Visit: Payer: Self-pay | Admitting: Emergency Medicine

## 2016-04-27 ENCOUNTER — Telehealth: Payer: Self-pay | Admitting: *Deleted

## 2016-04-27 NOTE — Telephone Encounter (Signed)
Faxed Rx to pharmacy on [address] and patient advised message left in voice mail.

## 2016-05-14 DIAGNOSIS — M150 Primary generalized (osteo)arthritis: Secondary | ICD-10-CM | POA: Diagnosis not present

## 2016-05-14 DIAGNOSIS — M1A09X1 Idiopathic chronic gout, multiple sites, with tophus (tophi): Secondary | ICD-10-CM | POA: Diagnosis not present

## 2016-05-31 ENCOUNTER — Ambulatory Visit (INDEPENDENT_AMBULATORY_CARE_PROVIDER_SITE_OTHER): Payer: BLUE CROSS/BLUE SHIELD

## 2016-05-31 ENCOUNTER — Ambulatory Visit (INDEPENDENT_AMBULATORY_CARE_PROVIDER_SITE_OTHER): Payer: BLUE CROSS/BLUE SHIELD | Admitting: Physician Assistant

## 2016-05-31 VITALS — BP 122/72 | HR 89 | Temp 98.6°F | Resp 17 | Ht 72.0 in | Wt 209.0 lb

## 2016-05-31 DIAGNOSIS — M25531 Pain in right wrist: Secondary | ICD-10-CM | POA: Diagnosis not present

## 2016-05-31 DIAGNOSIS — I10 Essential (primary) hypertension: Secondary | ICD-10-CM

## 2016-05-31 DIAGNOSIS — M189 Osteoarthritis of first carpometacarpal joint, unspecified: Secondary | ICD-10-CM | POA: Diagnosis not present

## 2016-05-31 LAB — POCT GLYCOSYLATED HEMOGLOBIN (HGB A1C): HEMOGLOBIN A1C: 5.1

## 2016-05-31 LAB — POCT CBC
Granulocyte percent: 64.5 %G (ref 37–80)
HCT, POC: 44.8 % (ref 43.5–53.7)
HEMOGLOBIN: 16 g/dL (ref 14.1–18.1)
Lymph, poc: 2.2 (ref 0.6–3.4)
MCH: 34.2 pg — AB (ref 27–31.2)
MCHC: 35.7 g/dL — AB (ref 31.8–35.4)
MCV: 95.9 fL (ref 80–97)
MID (cbc): 0.6 (ref 0–0.9)
MPV: 6.6 fL (ref 0–99.8)
POC GRANULOCYTE: 5 (ref 2–6.9)
POC LYMPH PERCENT: 28.2 %L (ref 10–50)
POC MID %: 7.3 % (ref 0–12)
Platelet Count, POC: 176 10*3/uL (ref 142–424)
RBC: 4.67 M/uL — AB (ref 4.69–6.13)
RDW, POC: 13 %
WBC: 7.7 10*3/uL (ref 4.6–10.2)

## 2016-05-31 MED ORDER — INDOMETHACIN 50 MG PO CAPS: 50.0000 mg | ORAL_CAPSULE | Freq: Three times a day (TID) | ORAL | 0 refills | Status: DC

## 2016-05-31 MED ORDER — TRAMADOL HCL 50 MG PO TABS: 50.0000 mg | ORAL_TABLET | Freq: Three times a day (TID) | ORAL | 0 refills | Status: DC | PRN

## 2016-05-31 MED ORDER — LISINOPRIL 10 MG PO TABS: 10.0000 mg | ORAL_TABLET | Freq: Every day | ORAL | 1 refills | Status: DC

## 2016-05-31 NOTE — Progress Notes (Signed)
05/31/2016 4:02 PM   DOB: 1961/09/16 / MRN: [id number]  SUBJECTIVE:  Blake Foley is a 55 y.o. male presenting for right wirst pain that started about 4 days ago.  Reports the pain is becoming unbearable. He has a history of gout and is taking cochicine and allopurinol for this. He can not tolerate PO steroids and simply states "it makes me sick."  Denies any trauma. He has not tried any medication for this yet.   He has No Known Allergies.   He  has a past medical history of COPD (chronic obstructive pulmonary disease) (HCC) and Hypertension.    He  reports that he has been smoking Cigarettes.  He has a 15.00 pack-year smoking history. He does not have any smokeless tobacco history on file. He reports that he drinks about 1.8 oz of alcohol per week . He reports that he does not use drugs. He  reports that he does not engage in sexual activity. The patient  has a past surgical history that includes Knee surgery.  His family history includes Stroke in his father.  Review of Systems  Constitutional: Negative for chills and fever.  Cardiovascular: Negative for chest pain.  Gastrointestinal: Negative for nausea.  Musculoskeletal: Positive for joint pain. Negative for back pain, falls, myalgias and neck pain.  Skin: Negative for itching and rash.  Neurological: Negative for dizziness and headaches.    The problem list and medications were reviewed and updated by myself where necessary and exist elsewhere in the encounter.   OBJECTIVE:  BP 122/72 (BP Location: Right Arm, Patient Position: Sitting, Cuff Size: Normal)   Pulse 89   Temp 98.6 F (37 C) (Oral)   Resp 17   Ht 6' (1.829 m)   Wt 209 lb (94.8 kg)   SpO2 96%   BMI 28.35 kg/m   Physical Exam  Cardiovascular: Normal rate.   Pulmonary/Chest: Effort normal and breath sounds normal.  Musculoskeletal:       Hands:  Lab Results  Component Value Date   CREATININE 0.64 01/18/2016   Results for orders placed or performed  in visit on 05/31/16 (from the past 72 hour(s))  POCT CBC     Status: Abnormal   Collection Time: 05/31/16  3:35 PM  Result Value Ref Range   WBC 7.7 4.6 - 10.2 K/uL   Lymph, poc 2.2 0.6 - 3.4   POC LYMPH PERCENT 28.2 10 - 50 %L   MID (cbc) 0.6 0 - 0.9   POC MID % 7.3 0 - 12 %M   POC Granulocyte 5.0 2 - 6.9   Granulocyte percent 64.5 37 - 80 %G   RBC 4.67 (A) 4.69 - 6.13 M/uL   Hemoglobin 16.0 14.1 - 18.1 g/dL   HCT, POC 44.8 43.5 - 53.7 %   MCV 95.9 80 - 97 fL   MCH, POC 34.2 (A) 27 - 31.2 pg   MCHC 35.7 (A) 31.8 - 35.4 g/dL   RDW, POC 13.0 %   Platelet Count, POC 176 142 - 424 K/uL   MPV 6.6 0 - 99.8 fL  POCT glycosylated hemoglobin (Hb A1C)     Status: None   Collection Time: 05/31/16  3:46 PM  Result Value Ref Range   Hemoglobin A1C 5.1    Lab Results  Component Value Date   TSH 0.88 01/31/2016     Dg Wrist Complete Right  Result Date: 05/31/2016 CLINICAL DATA:  Two days of right wrist pain and swelling. History of  gout EXAM: RIGHT WRIST - COMPLETE 3+ VIEW COMPARISON:  Right wrist series of February 28, 2013 FINDINGS: The bones are subjectively adequately mineralized. There is chronic deformity of the distal aspect of the ulna. This may be post traumatic or related to known gout. A tiny bony density at lies adjacent to the tip of the radial styloid and is stable. The radiocarpal and intercarpal joints are normal. There is moderate narrowing of the first [facility] joint which is slightly more conspicuous than on the previous studies. The soft tissues are unremarkable. IMPRESSION: Chronic changes associated with the distal ulna. Correlation with the site of the patient's symptoms is needed. I suspect the findings here are entirely post traumatic with resultant degenerative change but gouty involvement is not excluded. 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 Electronically Signed   By: David  Martinique M.D.   On: 05/31/2016 15:43    ASSESSMENT AND  PLAN  [name] was seen today for wrist pain.  Diagnoses and all orders for this visit:  Right wrist pain: Patient did not want tramadol.  Advised this would be helpful in conjunction with indomethacin.  He would like refills of his Xanax.  Advised I won't be carrying this medication for him and he would need to receive this from Dr. Everlene Farrier.  Patient in agreement.  -     DG Wrist Complete Right; Future -     POCT CBC -     Uric Acid -     POCT glycosylated hemoglobin (Hb A1C) -     indomethacin (INDOCIN) 50 MG capsule; Take 1 capsule (50 mg total) by mouth 3 (three) times daily with meals. -     traMADol (ULTRAM) 50 MG tablet; Take 1 tablet (50 mg total) by mouth every 8 (eight) hours as needed.  Well-controlled hypertension: Patient also requested a refill of Xanax -     lisinopril (PRINIVIL,ZESTRIL) 10 MG tablet; Take 1 tablet (10 mg total) by mouth daily. -     Potassium -     Creatinine with Est GFR    The patient is advised to call or return to clinic if he does not see an improvement in symptoms, or to seek the care of the closest emergency department if he worsens with the above plan.   Philis Fendt, MHS, PA-C [facility] 05/31/2016 4:02 PM

## 2016-05-31 NOTE — Patient Instructions (Signed)
     IF you received an x-ray today, you will receive an invoice from [facility]. Please contact [facility] at [phone] with questions or concerns regarding your invoice.   IF you received labwork today, you will receive an invoice from [facility]. Please contact [facility] at [phone] with questions or concerns regarding your invoice.   Our billing staff will not be able to assist you with questions regarding bills from these companies.  You will be contacted with the lab results as soon as they are available. The fastest way to get your results is to activate your My Chart account. Instructions are located on the last page of this paperwork. If you have not heard from us regarding the results in 2 weeks, please contact this office.      

## 2016-06-01 LAB — URIC ACID: URIC ACID, SERUM: 5.1 mg/dL (ref 4.0–8.0)

## 2016-06-01 LAB — CREATININE WITH EST GFR
CREATININE: 0.63 mg/dL — AB (ref 0.70–1.33)
GFR, Est African American: >89 mL/min (ref >=60)

## 2016-06-01 LAB — POTASSIUM: POTASSIUM: 4 mmol/L (ref 3.5–5.3)

## 2016-06-19 ENCOUNTER — Other Ambulatory Visit: Payer: Self-pay | Admitting: Emergency Medicine

## 2016-06-21 ENCOUNTER — Telehealth: Payer: Self-pay

## 2016-06-21 NOTE — Telephone Encounter (Signed)
Last OV w/Dr Daub 01/31/16, last RFs 8/12 for 2 mos RFs.

## 2016-06-21 NOTE — Telephone Encounter (Signed)
Voice mail left for pt to pick up. Call back number given if any questions.

## 2016-06-21 NOTE — Telephone Encounter (Signed)
Meds ordered this encounter  Medications  . ALPRAZolam (XANAX) 1 MG tablet    Sig: TAKE 1/2-1 TABLET BY MOUTH 2 TIMES A DAY AS NEEDED    Dispense:  60 tablet    Refill:  0    Not to exceed 4 additional fills before 10/24/2016.    Please advise patient that he needs OV/establish with new PCP for additional refills.

## 2016-06-24 NOTE — Telephone Encounter (Signed)
Called in and notified pt of need to est care with new provider.

## 2016-07-30 DIAGNOSIS — M1A09X1 Idiopathic chronic gout, multiple sites, with tophus (tophi): Secondary | ICD-10-CM | POA: Diagnosis not present

## 2016-09-15 ENCOUNTER — Other Ambulatory Visit: Payer: Self-pay | Admitting: Emergency Medicine

## 2016-10-07 ENCOUNTER — Other Ambulatory Visit: Payer: Self-pay | Admitting: Physician Assistant

## 2016-10-08 DIAGNOSIS — M150 Primary generalized (osteo)arthritis: Secondary | ICD-10-CM | POA: Diagnosis not present

## 2016-10-08 DIAGNOSIS — R21 Rash and other nonspecific skin eruption: Secondary | ICD-10-CM | POA: Diagnosis not present

## 2016-10-08 DIAGNOSIS — M0600 Rheumatoid arthritis without rheumatoid factor, unspecified site: Secondary | ICD-10-CM | POA: Diagnosis not present

## 2016-10-08 DIAGNOSIS — M1A09X1 Idiopathic chronic gout, multiple sites, with tophus (tophi): Secondary | ICD-10-CM | POA: Diagnosis not present

## 2016-10-09 ENCOUNTER — Telehealth: Payer: Self-pay | Admitting: Physician Assistant

## 2016-10-09 MED ORDER — ALPRAZOLAM 1 MG PO TABS: 0.5000 mg | ORAL_TABLET | Freq: Two times a day (BID) | ORAL | 0 refills | Status: DC | PRN

## 2016-10-09 NOTE — Telephone Encounter (Signed)
Left detailed voicemail that I am refilling the medication up until his scheduled appt with you 2/1/ 2018.

## 2016-10-10 ENCOUNTER — Other Ambulatory Visit: Payer: Self-pay | Admitting: Physician Assistant

## 2016-10-10 NOTE — Telephone Encounter (Signed)
PA-English completed refill on 10/09/2016. This is documented in the telephone encounter from the same date.

## 2016-10-17 ENCOUNTER — Ambulatory Visit: Payer: BLUE CROSS/BLUE SHIELD | Admitting: Physician Assistant

## 2016-10-30 ENCOUNTER — Other Ambulatory Visit: Payer: Self-pay | Admitting: Physician Assistant

## 2016-12-31 DIAGNOSIS — M150 Primary generalized (osteo)arthritis: Secondary | ICD-10-CM | POA: Diagnosis not present

## 2016-12-31 DIAGNOSIS — R21 Rash and other nonspecific skin eruption: Secondary | ICD-10-CM | POA: Diagnosis not present

## 2016-12-31 DIAGNOSIS — M1A09X1 Idiopathic chronic gout, multiple sites, with tophus (tophi): Secondary | ICD-10-CM | POA: Diagnosis not present

## 2016-12-31 DIAGNOSIS — M0600 Rheumatoid arthritis without rheumatoid factor, unspecified site: Secondary | ICD-10-CM | POA: Diagnosis not present

## 2017-01-27 ENCOUNTER — Other Ambulatory Visit: Payer: Self-pay | Admitting: Physician Assistant

## 2017-01-27 DIAGNOSIS — M1A09X1 Idiopathic chronic gout, multiple sites, with tophus (tophi): Secondary | ICD-10-CM | POA: Diagnosis not present

## 2017-01-27 DIAGNOSIS — Z79899 Other long term (current) drug therapy: Secondary | ICD-10-CM | POA: Diagnosis not present

## 2017-01-27 DIAGNOSIS — I10 Essential (primary) hypertension: Secondary | ICD-10-CM

## 2017-02-25 DIAGNOSIS — M1A09X1 Idiopathic chronic gout, multiple sites, with tophus (tophi): Secondary | ICD-10-CM | POA: Diagnosis not present

## 2017-02-25 DIAGNOSIS — Z79899 Other long term (current) drug therapy: Secondary | ICD-10-CM | POA: Diagnosis not present

## 2017-02-25 DIAGNOSIS — M0600 Rheumatoid arthritis without rheumatoid factor, unspecified site: Secondary | ICD-10-CM | POA: Diagnosis not present

## 2017-03-04 DIAGNOSIS — M0600 Rheumatoid arthritis without rheumatoid factor, unspecified site: Secondary | ICD-10-CM | POA: Diagnosis not present

## 2017-03-04 DIAGNOSIS — Z79899 Other long term (current) drug therapy: Secondary | ICD-10-CM | POA: Diagnosis not present

## 2017-03-04 DIAGNOSIS — M150 Primary generalized (osteo)arthritis: Secondary | ICD-10-CM | POA: Diagnosis not present

## 2017-03-04 DIAGNOSIS — M1A09X1 Idiopathic chronic gout, multiple sites, with tophus (tophi): Secondary | ICD-10-CM | POA: Diagnosis not present

## 2017-04-07 ENCOUNTER — Other Ambulatory Visit: Payer: Self-pay | Admitting: Physician Assistant

## 2017-04-09 DIAGNOSIS — M1A09X1 Idiopathic chronic gout, multiple sites, with tophus (tophi): Secondary | ICD-10-CM | POA: Diagnosis not present

## 2017-04-09 DIAGNOSIS — M25562 Pain in left knee: Secondary | ICD-10-CM | POA: Diagnosis not present

## 2017-04-09 DIAGNOSIS — R945 Abnormal results of liver function studies: Secondary | ICD-10-CM | POA: Diagnosis not present

## 2017-04-09 DIAGNOSIS — M150 Primary generalized (osteo)arthritis: Secondary | ICD-10-CM | POA: Diagnosis not present

## 2017-04-09 DIAGNOSIS — M0600 Rheumatoid arthritis without rheumatoid factor, unspecified site: Secondary | ICD-10-CM | POA: Diagnosis not present

## 2017-05-20 IMAGING — DX DG WRIST COMPLETE 3+V*R*
4 series · 4 of 4 positions shown · non-contrast
Comparison: Right wrist series of February 28, 2013

CLINICAL DATA: Two days of right wrist pain and swelling. History
of gout

EXAM:
RIGHT WRIST - COMPLETE 3+ VIEW

[wrist pa]
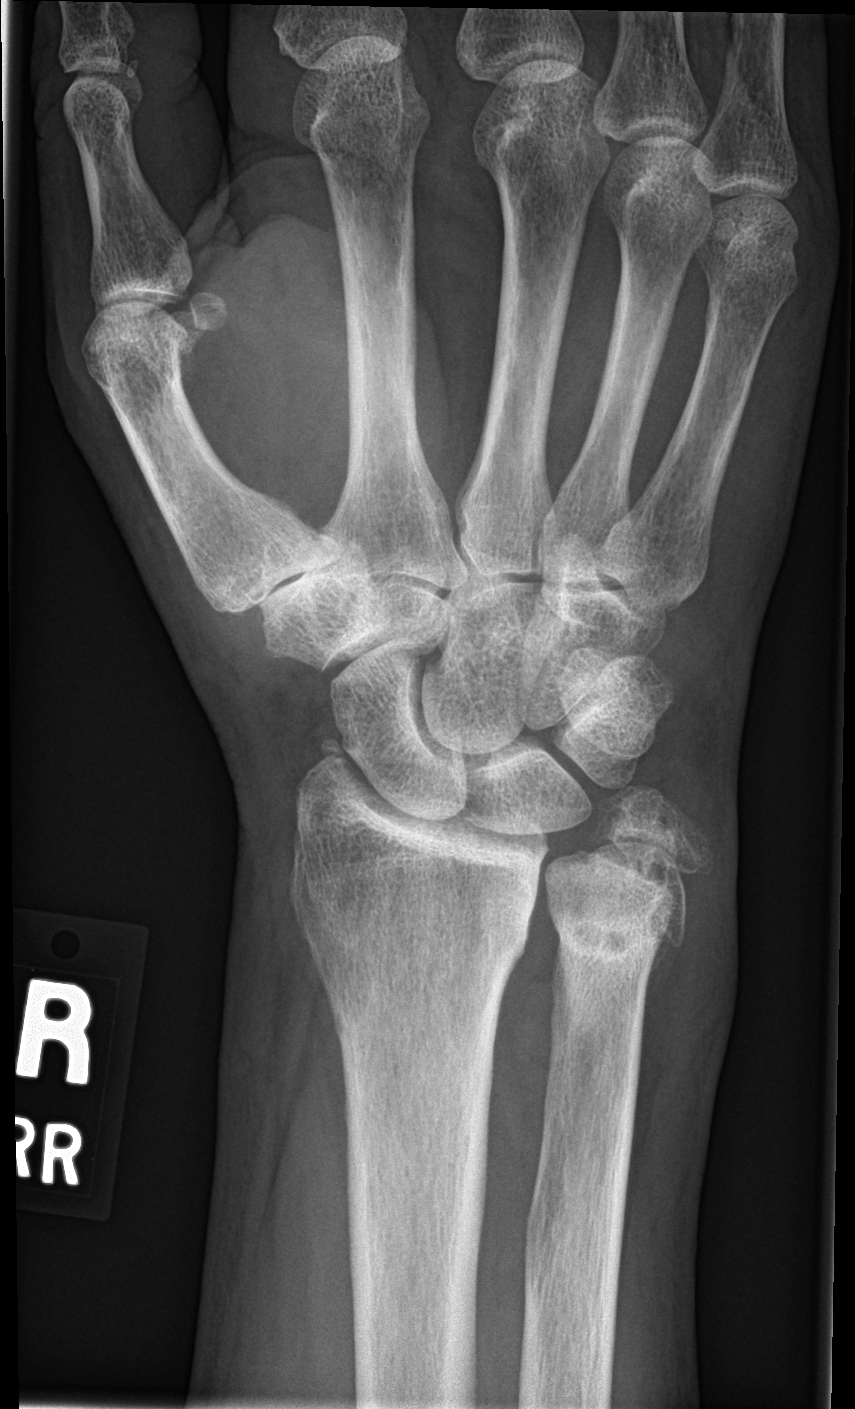

[wrist obl]
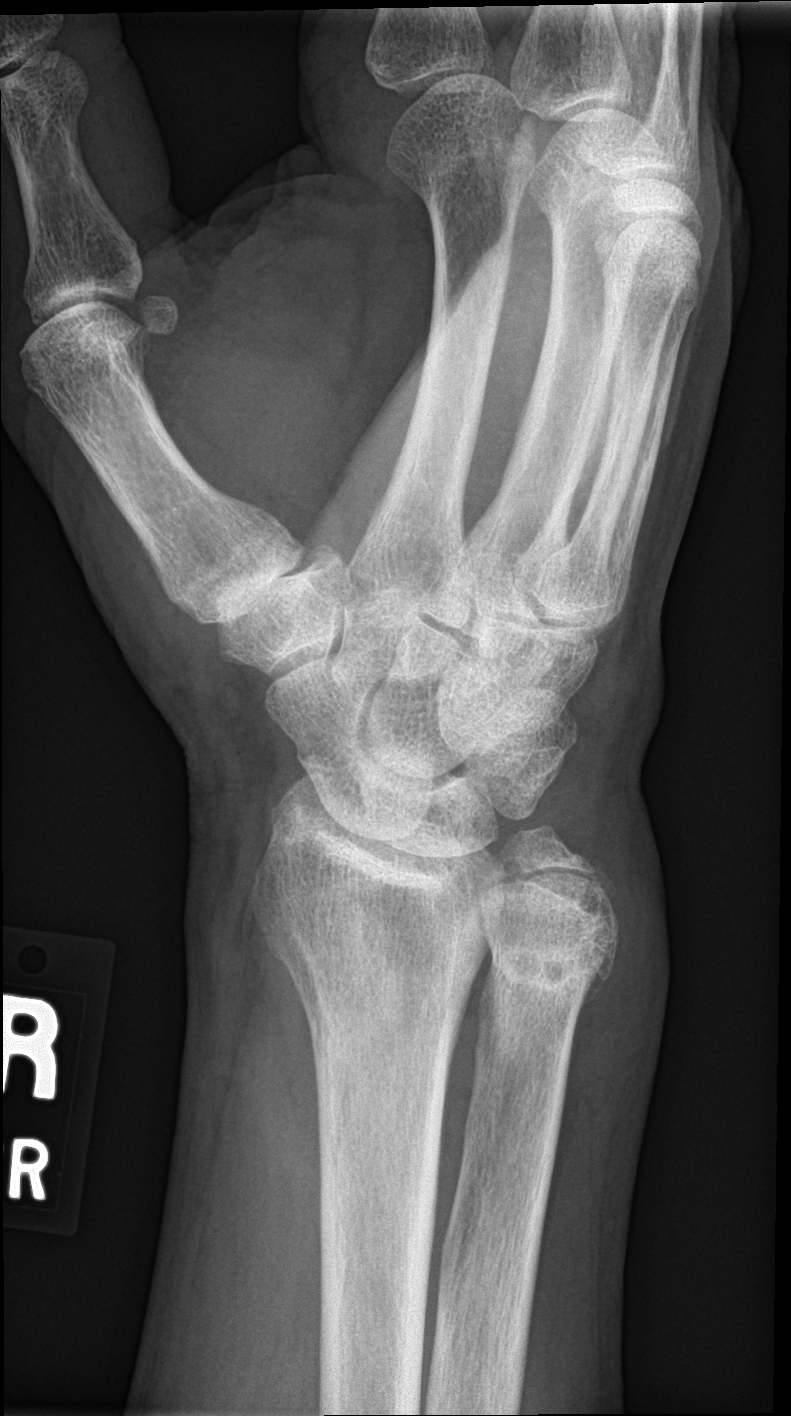

[wrist lat]
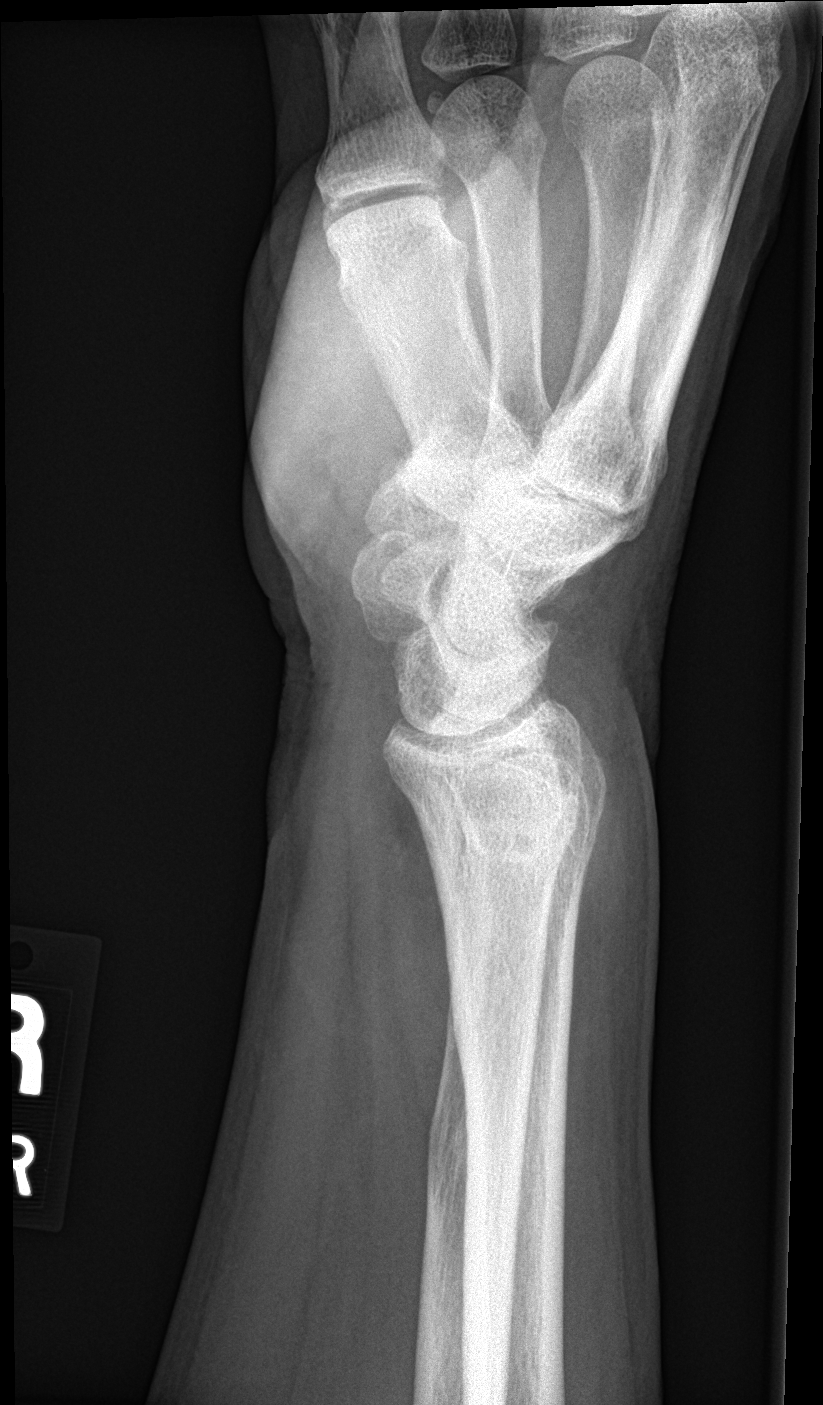

[wrist navicular]
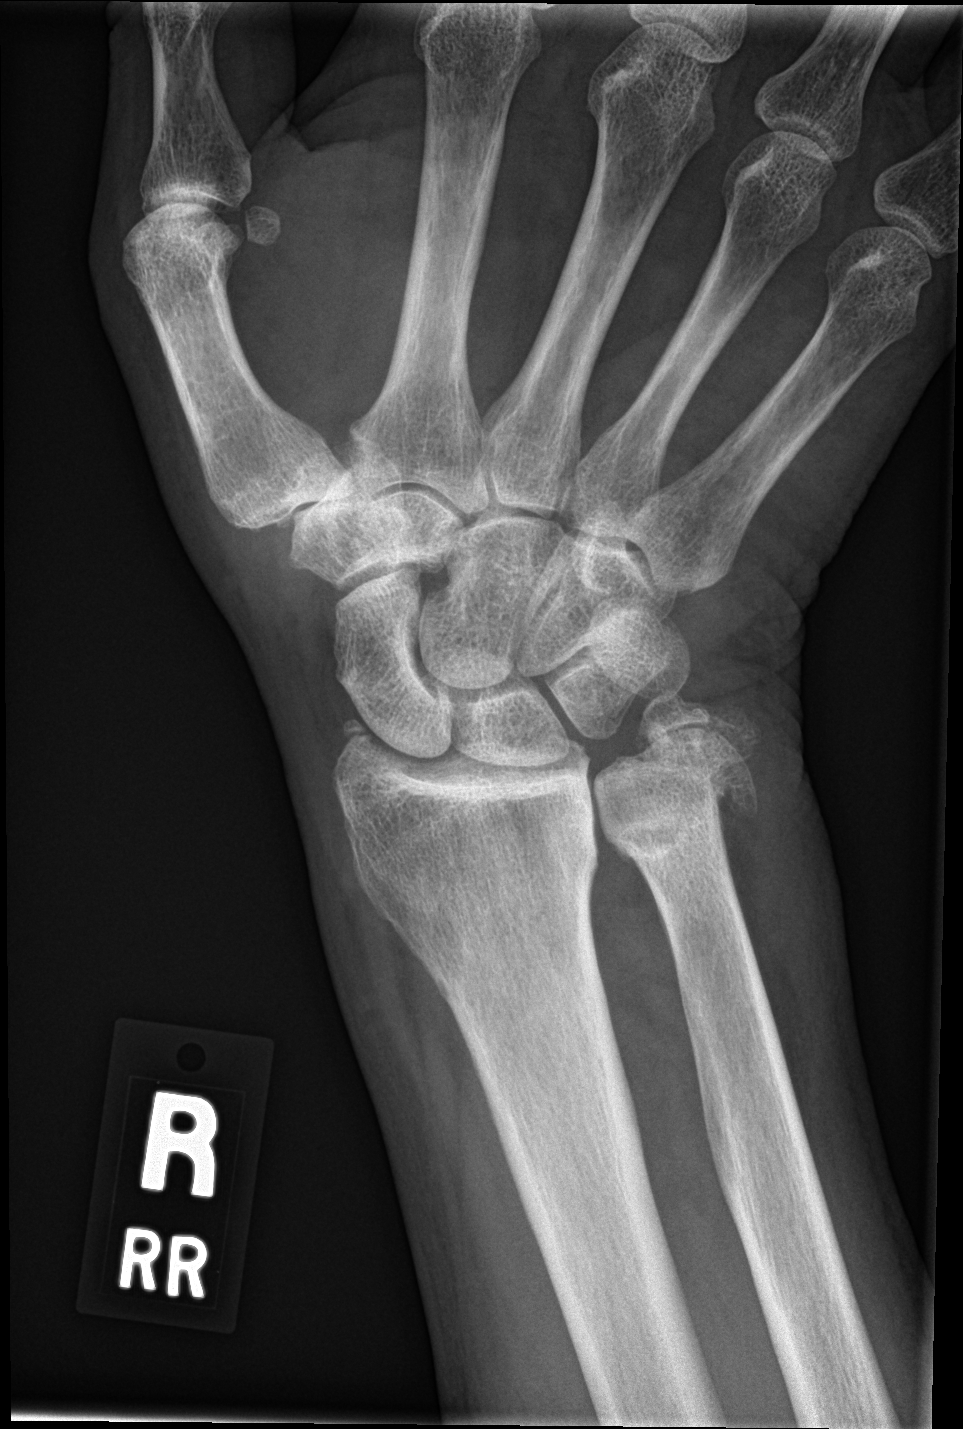

[4 of 4 positions shown; findings below may reference images not displayed]

FINDINGS: The bones are subjectively adequately mineralized. There is chronic
deformity of the distal aspect of the ulna. This may be post
traumatic or related to known gout. A tiny bony density at lies
adjacent to the tip of the radial styloid and is stable. The
radiocarpal and intercarpal joints are normal. There is moderate
narrowing of the first [facility] joint which is slightly more conspicuous
than on the previous studies. The soft tissues are unremarkable.
IMPRESSION: Chronic changes associated with the distal ulna. Correlation with
the site of the patient's symptoms is needed. I suspect the findings
here are entirely post traumatic with resultant degenerative change
but gouty involvement is not excluded.

Moderate osteoarthritic joint space loss of the first
carpometacarpal joint.

## 2017-08-14 ENCOUNTER — Telehealth: Payer: Self-pay | Admitting: Physician Assistant

## 2017-08-14 DIAGNOSIS — I10 Essential (primary) hypertension: Secondary | ICD-10-CM

## 2017-08-14 MED ORDER — ALBUTEROL SULFATE HFA 108 (90 BASE) MCG/ACT IN AERS: INHALATION_SPRAY | RESPIRATORY_TRACT | 0 refills | Status: DC

## 2017-08-14 MED ORDER — LISINOPRIL 10 MG PO TABS: 10.0000 mg | ORAL_TABLET | Freq: Every day | ORAL | 1 refills | Status: DC

## 2017-08-14 NOTE — Telephone Encounter (Signed)
Copied from [place] [id number]. Topic: General - Other >> Aug 14, 2017 12:50 PM Neva Seat wrote: Pt has appt with Dr. Gale Journey on Jan 8 @ 3pm Need Rx Refills on:   Lisinopril 10mg  - BP Medication ProAir -COPD medication

## 2017-08-14 NOTE — Telephone Encounter (Signed)
Pt has OV 10/03/16 Ordered 30 days worth for lisinopril, ProAir

## 2017-08-20 DIAGNOSIS — M1A09X1 Idiopathic chronic gout, multiple sites, with tophus (tophi): Secondary | ICD-10-CM | POA: Diagnosis not present

## 2017-08-20 DIAGNOSIS — M150 Primary generalized (osteo)arthritis: Secondary | ICD-10-CM | POA: Diagnosis not present

## 2017-08-20 DIAGNOSIS — M0600 Rheumatoid arthritis without rheumatoid factor, unspecified site: Secondary | ICD-10-CM | POA: Diagnosis not present

## 2017-08-20 DIAGNOSIS — R945 Abnormal results of liver function studies: Secondary | ICD-10-CM | POA: Diagnosis not present

## 2017-09-10 ENCOUNTER — Telehealth: Payer: Self-pay | Admitting: Physician Assistant

## 2017-09-10 NOTE — Telephone Encounter (Signed)
Called pt to reschedule his appt with Windell Hummingbird on 09/23/17. Judson Roch has had a change in her schedule and we will need to reschedule his appt.   When he calls back, please reschedule his appt for 09/30/16 or later.    Thanks!

## 2017-09-23 ENCOUNTER — Ambulatory Visit: Payer: BLUE CROSS/BLUE SHIELD | Admitting: Physician Assistant

## 2017-09-29 DIAGNOSIS — F1010 Alcohol abuse, uncomplicated: Secondary | ICD-10-CM | POA: Diagnosis not present

## 2017-09-29 DIAGNOSIS — R740 Nonspecific elevation of levels of transaminase and lactic acid dehydrogenase [LDH]: Secondary | ICD-10-CM | POA: Diagnosis not present

## 2017-09-29 DIAGNOSIS — Z1211 Encounter for screening for malignant neoplasm of colon: Secondary | ICD-10-CM | POA: Diagnosis not present

## 2017-12-06 ENCOUNTER — Other Ambulatory Visit: Payer: Self-pay | Admitting: Physician Assistant

## 2017-12-06 DIAGNOSIS — I10 Essential (primary) hypertension: Secondary | ICD-10-CM

## 2017-12-08 NOTE — Telephone Encounter (Signed)
Called pt to let him know about his RX refill. Left VM per DPR advising him to call in and make an appt in order to get a refill.  Thanks!

## 2018-01-12 ENCOUNTER — Other Ambulatory Visit: Payer: Self-pay | Admitting: Physician Assistant

## 2018-01-12 DIAGNOSIS — I10 Essential (primary) hypertension: Secondary | ICD-10-CM

## 2018-01-15 ENCOUNTER — Encounter: Payer: BLUE CROSS/BLUE SHIELD | Admitting: Physician Assistant

## 2018-02-05 DIAGNOSIS — M0600 Rheumatoid arthritis without rheumatoid factor, unspecified site: Secondary | ICD-10-CM | POA: Diagnosis not present

## 2018-02-05 DIAGNOSIS — R945 Abnormal results of liver function studies: Secondary | ICD-10-CM | POA: Diagnosis not present

## 2018-02-05 DIAGNOSIS — M150 Primary generalized (osteo)arthritis: Secondary | ICD-10-CM | POA: Diagnosis not present

## 2018-02-05 DIAGNOSIS — M1A09X1 Idiopathic chronic gout, multiple sites, with tophus (tophi): Secondary | ICD-10-CM | POA: Diagnosis not present

## 2018-02-12 ENCOUNTER — Encounter: Payer: Self-pay | Admitting: Physician Assistant

## 2018-02-12 ENCOUNTER — Ambulatory Visit (INDEPENDENT_AMBULATORY_CARE_PROVIDER_SITE_OTHER): Payer: BLUE CROSS/BLUE SHIELD | Admitting: Physician Assistant

## 2018-02-12 VITALS — BP 154/84 | HR 89 | Temp 98.0°F | Resp 16 | Ht 71.0 in | Wt 210.0 lb

## 2018-02-12 DIAGNOSIS — M1A49X0 Other secondary chronic gout, multiple sites, without tophus (tophi): Secondary | ICD-10-CM

## 2018-02-12 DIAGNOSIS — Z13220 Encounter for screening for lipoid disorders: Secondary | ICD-10-CM

## 2018-02-12 DIAGNOSIS — I10 Essential (primary) hypertension: Secondary | ICD-10-CM

## 2018-02-12 DIAGNOSIS — Z130 Encounter for screening for diseases of the blood and blood-forming organs and certain disorders involving the immune mechanism: Secondary | ICD-10-CM | POA: Diagnosis not present

## 2018-02-12 DIAGNOSIS — N529 Male erectile dysfunction, unspecified: Secondary | ICD-10-CM | POA: Diagnosis not present

## 2018-02-12 DIAGNOSIS — Z131 Encounter for screening for diabetes mellitus: Secondary | ICD-10-CM | POA: Diagnosis not present

## 2018-02-12 DIAGNOSIS — L989 Disorder of the skin and subcutaneous tissue, unspecified: Secondary | ICD-10-CM | POA: Diagnosis not present

## 2018-02-12 DIAGNOSIS — Z125 Encounter for screening for malignant neoplasm of prostate: Secondary | ICD-10-CM | POA: Diagnosis not present

## 2018-02-12 DIAGNOSIS — Z0000 Encounter for general adult medical examination without abnormal findings: Secondary | ICD-10-CM | POA: Diagnosis not present

## 2018-02-12 MED ORDER — LISINOPRIL 10 MG PO TABS: 10.0000 mg | ORAL_TABLET | Freq: Every day | ORAL | 1 refills | Status: DC

## 2018-02-12 NOTE — Patient Instructions (Signed)
     IF you received an x-ray today, you will receive an invoice from [facility]. Please contact [facility] at [phone] with questions or concerns regarding your invoice.   IF you received labwork today, you will receive an invoice from [facility]. Please contact [facility] at [phone] with questions or concerns regarding your invoice.   Our billing staff will not be able to assist you with questions regarding bills from these companies.  You will be contacted with the lab results as soon as they are available. The fastest way to get your results is to activate your My Chart account. Instructions are located on the last page of this paperwork. If you have not heard from us regarding the results in 2 weeks, please contact this office.     

## 2018-02-12 NOTE — Progress Notes (Signed)
Blake Foley  MRN: [id number] DOB: Jan 12, 1961  PCP: Mancel Bale, PA-C   Chief Complaint  Patient presents with  . Annual Exam    Subjective:  Pt presents to clinic for a CPE.  He has been out of his lisinopril for about 2 months.  Just has not found time to get into the office.  He has a area on his back that he is concerned about and would like to be checked today.  Recently saw his rheumatologist who is encouraging him to go on Enbrel.  He has had multiple lab evaluations to be able to start this medication.  Pt is having trouble maintaining erections.  He is able to obtain erections.  He has never had a testosterone checked.  Last dental exam: about 2 years ago preventative visit Last vision exam: within a year - new glasses Last colonoscopy: schedule in July - with Dr Benson [place] Vaccinations - UTD  Typical meals for patient: 3 meals (recently improved eating with home cooked meals), sometimes snacks Typical beverage choices: lemon water and beet juice Exercises: frisbee 3x week about 45 mins Sleeps:- bed around 8pm, wakes around 3:30-4am for work - sleeps well  Patient Active Problem List   Diagnosis Date Noted  . HTN (hypertension) 04/18/2014  . ETOH abuse 04/18/2014  . Gout 10/28/2011    Patient Care Team: Mittie Bodo as PCP - General (Physician Assistant) Marvene Staff (Family Medicine) Carol Ada, MD as Consulting Physician (Gastroenterology) Allyn Kenner, MD (Dermatology)  Review of Systems  Constitutional: Negative.   HENT: Negative.   Eyes: Negative.   Respiratory: Negative.   Cardiovascular: Negative.   Gastrointestinal: Negative.   Endocrine: Negative.   Genitourinary: Negative.   Musculoskeletal: Negative.   Skin:       Lesion - in the sun a lot of a child without sunscreen  Allergic/Immunologic: Negative.   Neurological: Negative.   Hematological: Negative.   Psychiatric/Behavioral: Negative.      Current Outpatient  Medications on File Prior to Visit  Medication Sig Dispense Refill  . albuterol (PROAIR HFA) 108 (90 Base) MCG/ACT inhaler TAKE 1 PUFF BY MOUTH EVERY 4 HOURS AS NEEDED 8.5 Inhaler 0  . allopurinol (ZYLOPRIM) 100 MG tablet Take 1 tablet (100 mg total) by mouth daily. 90 tablet 1  . GARLIC PO Take by mouth.    . Multiple Vitamin (MULTIVITAMIN) capsule Take 1 capsule by mouth daily.    . traMADol (ULTRAM) 50 MG tablet Take 1 tablet (50 mg total) by mouth every 8 (eight) hours as needed. 15 tablet 0  . ALPRAZolam (XANAX) 1 MG tablet Take 0.5-1 tablets (0.5-1 mg total) by mouth 2 (two) times daily as needed for anxiety. Need office visit prior to refill. (Patient not taking: Reported on 02/12/2018) 16 tablet 0  . colchicine 0.6 MG tablet Take 1 tablet 3 times a day (Patient not taking: Reported on 02/12/2018) 20 tablet 1  . HYDROcodone-acetaminophen (NORCO) 7.5-325 MG tablet Take 1 tablet by mouth every 8 (eight) hours as needed.  0  . lisinopril (PRINIVIL,ZESTRIL) 10 MG tablet TAKE 1 TABLET BY MOUTH EVERY DAY (Patient not taking: Reported on 02/12/2018) 30 tablet 0   No current facility-administered medications on file prior to visit.     No Known Allergies  Social History   Socioeconomic History  . Marital status: Married    Spouse name: Not on file  . Number of children: Not on file  . Years of education: Not  on file  . Highest education level: Not on file  Occupational History  . Not on file  Social Needs  . Financial resource strain: Not on file  . Food insecurity:    Worry: Not on file    Inability: Not on file  . Transportation needs:    Medical: Not on file    Non-medical: Not on file  Tobacco Use  . Smoking status: Current Every Day Smoker    Packs/day: 0.50    Years: 30.00    Pack years: 15.00    Types: Cigarettes  . Smokeless tobacco: Never Used  Substance and Sexual Activity  . Alcohol use: Yes    Alcohol/week: 1.8 oz    Types: 3 Cans of beer per week    Comment: 2-3  beers weekdays, 4-5 weekends  . Drug use: No  . Sexual activity: Yes    Partners: Female  Lifestyle  . Physical activity:    Days per week: Not on file    Minutes per session: Not on file  . Stress: Not on file  Relationships  . Social connections:    Talks on phone: Not on file    Gets together: Not on file    Attends religious service: Not on file    Active member of club or organization: Not on file    Attends meetings of clubs or organizations: Not on file    Relationship status: Not on file  Other Topics Concern  . Not on file  Social History Narrative   Lives with wife      Gun - in storage unit    Past Surgical History:  Procedure Laterality Date  . KNEE SURGERY Left     Family History  Problem Relation Age of Onset  . Stroke Father   . Diabetes Brother      Objective:  BP (!) 154/84 (BP Location: Left Arm, Patient Position: Sitting, Cuff Size: Normal)   Pulse 89   Temp 98 F (36.7 C) (Oral)   Resp 16   Ht _0  (1.803 m)   Wt 210 lb (95.3 kg)   SpO2 95%   BMI 29.29 kg/m   Physical Exam  Constitutional: He is oriented to person, place, and time. He appears well-developed and well-nourished.  HENT:  Head: Normocephalic and atraumatic.  Right Ear: Hearing, tympanic membrane, external ear and ear canal normal.  Left Ear: Hearing, tympanic membrane, external ear and ear canal normal.  Nose: Nose normal.  Mouth/Throat: Uvula is midline, oropharynx is clear and moist and mucous membranes are normal.  Eyes: Pupils are equal, round, and reactive to light. Conjunctivae and EOM are normal.  Neck: Trachea normal and normal range of motion. Neck supple. No thyroid mass and no thyromegaly present.  Cardiovascular: Normal rate, regular rhythm and normal heart sounds.  No murmur heard. Pulmonary/Chest: Effort normal and breath sounds normal. He has no wheezes.  Abdominal: Soft. Normal appearance and bowel sounds are normal. Hernia confirmed negative in the right  inguinal area and confirmed negative in the left inguinal area.  Genitourinary: Rectum normal, testes normal and penis normal. Prostate is not enlarged and not tender. Cremasteric reflex is present. Circumcised.  Musculoskeletal: Normal range of motion.  Neurological: He is alert and oriented to person, place, and time.  Skin: Skin is warm and dry.  Multiple actinic keratoses on arms.  On right shoulder and enlarged darker area with scabbed over middle.  Most likely actinic keratoses but due to coloring want to  do a shave biopsy.  Psychiatric: He has a normal mood and affect. His behavior is normal. Judgment and thought content normal.  Vitals reviewed.   Wt Readings from Last 3 Encounters:  02/12/18 210 lb (95.3 kg)  05/31/16 209 lb (94.8 kg)  01/31/16 209 lb 6.4 oz (95 kg)     Visual Acuity Screening   Right eye Left eye Both eyes  Without correction: 20/30 20/30-1 20/20  With correction:      Rhythm: sinus rhythm  at a rate of 78. Findings: no acute change, NSR Last EKG: 01/21/2016  Changes from last EKG: No I have personally reviewed the EKG tracing and agree with the computerized printout.  Assessment and Plan :  Annual physical exam -anticipatory guidance given   hypertension - Plan: CMP14+EGFR, TSH, EKG 12-Lead, Urinalysis, dipstick only patient has well-controlled hypertension when on medications, he has been out for 2 months therefore it is elevated today.  We will restart him on medications -   Other secondary chronic gout of multiple sites without tophus -continue plans with rheumatology  Screening for deficiency anemia - Plan: CBC with Differential/Platelet  Screening for diabetes mellitus - Plan: Hemoglobin A1c  Screening, lipid - Plan: Lipid panel  Screening PSA (prostate specific antigen) - Plan: PSA  Inability to maintain erection - Plan: TestT+TestF+SHBG  Skin lesio patient will return to clinic in 1 week to have a shave biopsy and the sent to pathology. Due  to the need for fasting testosterone, patient will return and have all blood work done when he returns for his above appointment for skin lesion removal.  .    Windell Hummingbird PA-C  [facility] 02/12/2018 7:27 PM

## 2018-02-13 LAB — URINALYSIS, DIPSTICK ONLY
Bilirubin, UA: NEGATIVE
GLUCOSE, UA: NEGATIVE
KETONES UA: NEGATIVE
LEUKOCYTES UA: NEGATIVE
Nitrite, UA: NEGATIVE
PROTEIN UA: NEGATIVE
RBC UA: NEGATIVE
SPEC GRAV UA: 1.015 (ref 1.005–1.030)
Urobilinogen, Ur: 0.2 mg/dL (ref 0.2–1.0)
pH, UA: 5.5 (ref 5.0–7.5)

## 2018-02-24 ENCOUNTER — Ambulatory Visit: Payer: BLUE CROSS/BLUE SHIELD | Admitting: Physician Assistant

## 2018-03-03 ENCOUNTER — Ambulatory Visit: Payer: BLUE CROSS/BLUE SHIELD | Admitting: Physician Assistant

## 2018-03-03 ENCOUNTER — Other Ambulatory Visit: Payer: Self-pay

## 2018-03-03 VITALS — BP 152/86 | HR 114 | Temp 98.2°F | Resp 18 | Ht 71.0 in | Wt 209.2 lb

## 2018-03-03 DIAGNOSIS — L989 Disorder of the skin and subcutaneous tissue, unspecified: Secondary | ICD-10-CM

## 2018-03-03 DIAGNOSIS — Z131 Encounter for screening for diabetes mellitus: Secondary | ICD-10-CM

## 2018-03-03 DIAGNOSIS — D235 Other benign neoplasm of skin of trunk: Secondary | ICD-10-CM | POA: Diagnosis not present

## 2018-03-03 DIAGNOSIS — Z125 Encounter for screening for malignant neoplasm of prostate: Secondary | ICD-10-CM | POA: Diagnosis not present

## 2018-03-03 DIAGNOSIS — I10 Essential (primary) hypertension: Secondary | ICD-10-CM

## 2018-03-03 DIAGNOSIS — L821 Other seborrheic keratosis: Secondary | ICD-10-CM | POA: Diagnosis not present

## 2018-03-03 DIAGNOSIS — N529 Male erectile dysfunction, unspecified: Secondary | ICD-10-CM

## 2018-03-03 DIAGNOSIS — Z130 Encounter for screening for diseases of the blood and blood-forming organs and certain disorders involving the immune mechanism: Secondary | ICD-10-CM

## 2018-03-03 DIAGNOSIS — Z13220 Encounter for screening for lipoid disorders: Secondary | ICD-10-CM | POA: Diagnosis not present

## 2018-03-03 NOTE — Patient Instructions (Addendum)
  Ok to use antibiotic ointment on the wound with a bandaid.  Expect this to be healed in several days.  For the stomach upset -   Start with ice chips and then sips of clear fluids.  Once you are able to tolerated drinking liquids you can start with bland foods such as rice, apple sauce and toast.  If you can tolerate this you can advance diet as tolerated.  Limit dairy for several days to reduce return of diarrhea if you have been experiencing diarrhea.    IF you received an x-ray today, you will receive an invoice from [facility]. Please contact [facility] at [phone] with questions or concerns regarding your invoice.   IF you received labwork today, you will receive an invoice from [facility]. Please contact [facility] at [phone] with questions or concerns regarding your invoice.   Our billing staff will not be able to assist you with questions regarding bills from these companies.  You will be contacted with the lab results as soon as they are available. The fastest way to get your results is to activate your My Chart account. Instructions are located on the last page of this paperwork. If you have not heard from [place] regarding the results in 2 weeks, please contact this office.

## 2018-03-03 NOTE — Progress Notes (Signed)
Blake Foley  MRN: [id number] DOB: 10/26/1960  PCP: Mancel Bale, PA-C  Chief Complaint  Patient presents with  . Hypertension    follow up and blood work     Subjective:  Blake Foley is a pleasant 57 year old presenting for a recheck on hypertension and for morning blood work. He would also like to proceed with the shave biopsy of his back nevus discussed on 02/02/18 visit.   Of note, he complains of slight stomach pain that started last night. He states that he feels "yuck" and overall not himself - however, he would not have brought it to a provider's attention if he did not already have an appointment today. He reports his wife is also feeling similar and is home. They both ate pork last night and wonder if the illness feeling is related to something they ate.   He has already been to work since [employer], but would like a work note until tomorrow just to ensure recovery. He notes he cannot remember the last time he called out of work.  History is obtained by patient.  Review of Systems  Constitutional: Negative for chills and fever.  HENT: Negative for sore throat.   Eyes: Negative for pain and visual disturbance.  Respiratory: Negative for chest tightness and shortness of breath.   Cardiovascular: Negative for chest pain and palpitations.  Gastrointestinal: Positive for abdominal pain. Negative for blood in stool, constipation, diarrhea and vomiting.  Genitourinary: Negative for difficulty urinating, dysuria, frequency and hematuria.  Neurological: Negative for headaches.    Patient Active Problem List   Diagnosis Date Noted  . HTN (hypertension) 04/18/2014  . ETOH abuse 04/18/2014  . Gout 10/28/2011    Current Outpatient Medications on File Prior to Visit  Medication Sig Dispense Refill  . albuterol (PROAIR HFA) 108 (90 Base) MCG/ACT inhaler TAKE 1 PUFF BY MOUTH EVERY 4 HOURS AS NEEDED 8.5 Inhaler 0  . allopurinol (ZYLOPRIM) 100 MG tablet Take 1 tablet (100 mg  total) by mouth daily. (Patient taking differently: Take 600 mg by mouth daily. ) 90 tablet 1  . GARLIC PO Take by mouth.    Marland Kitchen HYDROcodone-acetaminophen (NORCO) 7.5-325 MG tablet Take 1 tablet by mouth every 8 (eight) hours as needed.  0  . lisinopril (PRINIVIL,ZESTRIL) 10 MG tablet Take 1 tablet (10 mg total) by mouth daily. 90 tablet 1  . Multiple Vitamin (MULTIVITAMIN) capsule Take 1 capsule by mouth daily.     No current facility-administered medications on file prior to visit.     No Known Allergies  Past Medical History:  Diagnosis Date  . COPD (chronic obstructive pulmonary disease) ([place])   . Hypertension    Social History   Social History Narrative   Lives with wife      Gun - in storage unit   Social History   Tobacco Use  . Smoking status: Current Every Day Smoker    Packs/day: 0.50    Years: 30.00    Pack years: 15.00    Types: Cigarettes  . Smokeless tobacco: Never Used  Substance Use Topics  . Alcohol use: Yes    Alcohol/week: 1.8 oz    Types: 3 Cans of beer per week    Comment: 2-3 beers weekdays, 4-5 weekends  . Drug use: No   family history includes Diabetes in his brother; Stroke in his father.     Objective:  There were no vitals taken for this visit. There is no height or weight on file  to calculate BMI.  Wt Readings from Last 3 Encounters:  02/12/18 210 lb (95.3 kg)  05/31/16 209 lb (94.8 kg)  01/31/16 209 lb 6.4 oz (95 kg)    Physical Exam  Constitutional: He is oriented to person, place, and time. He appears well-developed and well-nourished. No distress.  HENT:  Head: Normocephalic and atraumatic.  Right Ear: External ear normal.  Left Ear: External ear normal.  Eyes: Pupils are equal, round, and reactive to light. Conjunctivae are normal.  Neck: Normal range of motion. No thyromegaly present.  Cardiovascular: Normal rate, regular rhythm, normal heart sounds and intact distal pulses. Exam reveals no gallop and no friction rub.  No  murmur heard. Pulmonary/Chest: Effort normal and breath sounds normal. No stridor. He has no wheezes. He has no rales.  Abdominal: Soft. There is no tenderness. There is no guarding.  Musculoskeletal:       Arms:      Right lower leg: He exhibits no edema.       Left lower leg: He exhibits no edema.  Lymphadenopathy:    He has no cervical adenopathy.  Neurological: He is alert and oriented to person, place, and time.  Skin: Skin is warm and dry.     Psychiatric: He has a normal mood and affect. His behavior is normal. Judgment normal.   Vitals:   03/03/18 0810  BP: (!) 152/86  Pulse: (!) 114  Resp: 18  Temp: 98.2 F (36.8 C)  SpO2: 95%   Blood pressure rechecked in right arm and found to be around 140/80.  Procedure: Verbal consent obtained.  Local anesthesia with 2% lidocaine with epi.  Area cleaned.  Shave biopsy performed to remove the entire lesion.  Lesion sent for biopsy.  Dressing placed. Assessment and Plan :  1. Skin lesion of back Send lesion for biopsy suspect to be actinic keratoses overlying nevus patient understands wound care -   2. Essential hypertension Blood pressure still remains mildly uncontrolled. Will proceed more agressively at this time to attain goal of consistently under 140/90.  INCREASE Lisinopril 35m to 26mdaily. Take 2 tablets of current 1086mrescription until follow-up visit in 4 weeks. Will send new prescription at that time.   3. Inability to maintain erection - TestT+TestF+SHBG - Re-evaluate pending labs.   4. Screening PSA (prostate specific antigen) - PSA  5. Well-controlled hypertension - TSH - CMP14+EGFR  6. Screening, lipid - Lipid panel  7. Screening for diabetes mellitus - Hemoglobin A1c  8. Screening for deficiency anemia - CBC with Differential/Platelet  I was directly involved with the patient's care and agree with the physical, diagnosis and treatment plan. Note was adjusted with my history and physical exam  findings.   SarWindell Hummingbird-C  [facility] 03/03/2018 8:10 AM

## 2018-03-06 LAB — CMP14+EGFR
ALBUMIN: 4.6 g/dL (ref 3.5–5.5)
ALK PHOS: 107 IU/L (ref 39–117)
ALT: 22 IU/L (ref 0–44)
AST: 22 IU/L (ref 0–40)
Albumin/Globulin Ratio: 1.8 (ref 1.2–2.2)
BILIRUBIN TOTAL: 0.5 mg/dL (ref 0.0–1.2)
BUN / CREAT RATIO: 14 (ref 9–20)
BUN: 11 mg/dL (ref 6–24)
CHLORIDE: 100 mmol/L (ref 96–106)
CO2: 23 mmol/L (ref 20–29)
CREATININE: 0.76 mg/dL (ref 0.76–1.27)
Calcium: 9.9 mg/dL (ref 8.7–10.2)
GFR calc non Af Amer: 101 mL/min/{1.73_m2} (ref >59)
GFR, EST AFRICAN AMERICAN: 117 mL/min/{1.73_m2} (ref >59)
GLOBULIN, TOTAL: 2.6 g/dL (ref 1.5–4.5)
GLUCOSE: 100 mg/dL — AB (ref 65–99)
Potassium: 4.2 mmol/L (ref 3.5–5.2)
Sodium: 140 mmol/L (ref 134–144)
TOTAL PROTEIN: 7.2 g/dL (ref 6.0–8.5)

## 2018-03-06 LAB — CBC WITH DIFFERENTIAL/PLATELET
Basophils Absolute: 0.1 10*3/uL (ref 0.0–0.2)
Basos: 1 %
EOS (ABSOLUTE): 0.3 10*3/uL (ref 0.0–0.4)
EOS: 3 %
HEMATOCRIT: 45.9 % (ref 37.5–51.0)
HEMOGLOBIN: 16 g/dL (ref 13.0–17.7)
Immature Grans (Abs): 0 10*3/uL (ref 0.0–0.1)
Immature Granulocytes: 0 %
LYMPHS ABS: 2.2 10*3/uL (ref 0.7–3.1)
Lymphs: 26 %
MCH: 32.9 pg (ref 26.6–33.0)
MCHC: 34.9 g/dL (ref 31.5–35.7)
MCV: 94 fL (ref 79–97)
MONOCYTES: 9 %
MONOS ABS: 0.7 10*3/uL (ref 0.1–0.9)
NEUTROS ABS: 5 10*3/uL (ref 1.4–7.0)
Neutrophils: 61 %
Platelets: 257 10*3/uL (ref 150–450)
RBC: 4.87 x10E6/uL (ref 4.14–5.80)
RDW: 13 % (ref 12.3–15.4)
WBC: 8.2 10*3/uL (ref 3.4–10.8)

## 2018-03-06 LAB — LIPID PANEL
CHOL/HDL RATIO: 1.6 ratio (ref 0.0–5.0)
Cholesterol, Total: 142 mg/dL (ref 100–199)
HDL: 89 mg/dL (ref >39)
LDL CALC: 39 mg/dL (ref 0–99)
TRIGLYCERIDES: 68 mg/dL (ref 0–149)
VLDL Cholesterol Cal: 14 mg/dL (ref 5–40)

## 2018-03-06 LAB — TESTT+TESTF+SHBG
Sex Hormone Binding: 69.6 nmol/L (ref 19.3–76.4)
Testosterone, Free: 4.7 pg/mL — ABNORMAL LOW (ref 7.2–24.0)
Testosterone, total: 411.2 ng/dL (ref 264.0–916.0)

## 2018-03-06 LAB — HEMOGLOBIN A1C
Est. average glucose Bld gHb Est-mCnc: 103 mg/dL
Hgb A1c MFr Bld: 5.2 % (ref 4.8–5.6)

## 2018-03-06 LAB — TSH: TSH: 1.28 u[IU]/mL (ref 0.450–4.500)

## 2018-03-06 LAB — PSA: Prostate Specific Ag, Serum: 0.5 ng/mL (ref 0.0–4.0)

## 2018-03-12 ENCOUNTER — Telehealth: Payer: Self-pay | Admitting: Physician Assistant

## 2018-03-12 NOTE — Telephone Encounter (Signed)
Left message on voicemail for pt to return call to office for results.    

## 2018-03-12 NOTE — Telephone Encounter (Signed)
Returning call Copied from [facility] [id number]. Topic: Quick Communication - Lab Results >> Mar 10, 2018 10:11 AM Marin Roberts, [place] wrote: Called patient to inform them of 03/03/18 lab results. When patient returns call, triage nurse may disclose results.

## 2018-03-13 NOTE — Telephone Encounter (Signed)
Pt given results per notes of Windell Hummingbird, PA-C on 03/10/18, patient verbalized understanding. He says he would like to see a urologist. He asks if his lab results be sent to his rheumatologist, Dr. Marella Chimes at [facility]. Unable to document in result note due to result note not being routed to [facility].

## 2018-04-02 ENCOUNTER — Ambulatory Visit: Payer: BLUE CROSS/BLUE SHIELD | Admitting: Physician Assistant

## 2018-04-10 ENCOUNTER — Encounter: Payer: Self-pay | Admitting: Physician Assistant

## 2018-04-10 ENCOUNTER — Other Ambulatory Visit: Payer: Self-pay

## 2018-04-10 ENCOUNTER — Ambulatory Visit: Payer: BLUE CROSS/BLUE SHIELD | Admitting: Physician Assistant

## 2018-04-10 VITALS — BP 122/68 | HR 104 | Temp 98.0°F | Resp 18 | Ht 71.0 in | Wt 209.8 lb

## 2018-04-10 DIAGNOSIS — I10 Essential (primary) hypertension: Secondary | ICD-10-CM | POA: Diagnosis not present

## 2018-04-10 DIAGNOSIS — R7989 Other specified abnormal findings of blood chemistry: Secondary | ICD-10-CM

## 2018-04-10 DIAGNOSIS — N529 Male erectile dysfunction, unspecified: Secondary | ICD-10-CM | POA: Diagnosis not present

## 2018-04-10 MED ORDER — LISINOPRIL 20 MG PO TABS: 20.0000 mg | ORAL_TABLET | Freq: Every day | ORAL | 1 refills | Status: AC

## 2018-04-10 NOTE — Patient Instructions (Signed)
     IF you received an x-ray today, you will receive an invoice from [facility]. Please contact [facility] at [phone] with questions or concerns regarding your invoice.   IF you received labwork today, you will receive an invoice from [facility]. Please contact [facility] at [phone] with questions or concerns regarding your invoice.   Our billing staff will not be able to assist you with questions regarding bills from these companies.  You will be contacted with the lab results as soon as they are available. The fastest way to get your results is to activate your My Chart account. Instructions are located on the last page of this paperwork. If you have not heard from us regarding the results in 2 weeks, please contact this office.     

## 2018-04-10 NOTE — Progress Notes (Signed)
Blake Foley  MRN: [id number] DOB: 1961/04/18  PCP: Mancel Bale, PA-C  Chief Complaint  Patient presents with  . Hypertension    follow up     Subjective:  Pt presents to clinic for medication recheck for his HTN.  He has ben taking lisinopril 20mg  for the last month.  He feels goods. He would like a referral to the urologist to have a discussion about his testosterone and erection problems.  History is obtained by patient.  Review of Systems  Constitutional: Negative for chills and fever.  Eyes: Negative for visual disturbance.  Respiratory: Negative for cough and shortness of breath.   Cardiovascular: Negative for chest pain, palpitations and leg swelling.  Neurological: Negative for dizziness, light-headedness and headaches.    Patient Active Problem List   Diagnosis Date Noted  . HTN (hypertension) 04/18/2014  . ETOH abuse 04/18/2014  . Gout 10/28/2011    Current Outpatient Medications on File Prior to Visit  Medication Sig Dispense Refill  . albuterol (PROAIR HFA) 108 (90 Base) MCG/ACT inhaler TAKE 1 PUFF BY MOUTH EVERY 4 HOURS AS NEEDED 8.5 Inhaler 0  . allopurinol (ZYLOPRIM) 300 MG tablet Take 600 mg by mouth daily.    Marland Kitchen GARLIC PO Take by mouth.    Marland Kitchen HYDROcodone-acetaminophen (NORCO) 7.5-325 MG tablet Take 1 tablet by mouth every 8 (eight) hours as needed.  0  . Multiple Vitamin (MULTIVITAMIN) capsule Take 1 capsule by mouth daily.    . ENBREL MINI 50 MG/ML SOCT     . folic acid (FOLVITE) 1 MG tablet      No current facility-administered medications on file prior to visit.     No Known Allergies  Past Medical History:  Diagnosis Date  . COPD (chronic obstructive pulmonary disease) ([place])   . Hypertension    Social History   Social History Narrative   Lives with wife      Gun - in storage unit   Social History   Tobacco Use  . Smoking status: Current Every Day Smoker    Packs/day: 0.50    Years: 30.00    Pack years: 15.00    Types:  Cigarettes  . Smokeless tobacco: Never Used  Substance Use Topics  . Alcohol use: Yes    Alcohol/week: 1.8 oz    Types: 3 Cans of beer per week    Comment: 2-3 beers weekdays, 4-5 weekends  . Drug use: No   family history includes Diabetes in his brother; Stroke in his father.     Objective:  BP 122/68   Pulse (!) 104   Temp 98 F (36.7 C) (Oral)   Resp 18   Ht 5\' 11"  (1.803 m)   Wt 209 lb 12.8 oz (95.2 kg)   SpO2 96%   BMI 29.26 kg/m  Body mass index is 29.26 kg/m.  Wt Readings from Last 3 Encounters:  04/10/18 209 lb 12.8 oz (95.2 kg)  03/03/18 209 lb 3.2 oz (94.9 kg)  02/12/18 210 lb (95.3 kg)    Physical Exam  Constitutional: He is oriented to person, place, and time. He appears well-developed and well-nourished.  HENT:  Head: Normocephalic and atraumatic.  Right Ear: External ear normal.  Left Ear: External ear normal.  Eyes: Conjunctivae are normal.  Neck: Normal range of motion.  Cardiovascular: Normal rate, regular rhythm, normal heart sounds and intact distal pulses.  Pulmonary/Chest: Effort normal and breath sounds normal. He has no wheezes.  Musculoskeletal:  Right lower leg: He exhibits no edema.       Left lower leg: He exhibits no edema.  Neurological: He is alert and oriented to person, place, and time.  Skin: Skin is warm and dry.  Psychiatric: Judgment normal.  Vitals reviewed.   Assessment and Plan :  Low testosterone in male - Plan: Ambulatory referral to Urology  Essential hypertension - Plan: lisinopril (PRINIVIL,ZESTRIL) 20 MG tablet  Inability to maintain erection - Plan: Ambulatory referral to Urology   Well controlled BP in patient.  Recheck in 3 months - referral made for patient.  Patient verbalized to me that they understand the following: diagnosis, what is being done for them, what to expect and what should be done at home.  Their questions have been answered.  See after visit summary for patient specific  instructions.  Windell Hummingbird PA-C  Primary [facility] at [facility] 04/10/2018 2:44 PM  Please note: Portions of this report may have been transcribed using dragon voice recognition software. Every effort was made to ensure accuracy; however, inadvertent computerized transcription errors may be present.

## 2018-05-08 DIAGNOSIS — M0600 Rheumatoid arthritis without rheumatoid factor, unspecified site: Secondary | ICD-10-CM | POA: Diagnosis not present

## 2018-05-08 DIAGNOSIS — M1A09X1 Idiopathic chronic gout, multiple sites, with tophus (tophi): Secondary | ICD-10-CM | POA: Diagnosis not present

## 2018-05-08 DIAGNOSIS — R945 Abnormal results of liver function studies: Secondary | ICD-10-CM | POA: Diagnosis not present

## 2018-05-08 DIAGNOSIS — M150 Primary generalized (osteo)arthritis: Secondary | ICD-10-CM | POA: Diagnosis not present

## 2018-05-16 ENCOUNTER — Telehealth: Payer: Self-pay | Admitting: Physician Assistant

## 2018-05-19 NOTE — Telephone Encounter (Signed)
Albuterol inhaler 108  refill Last Refill:01/13/18 # 1 Last OV: 04/10/18 PCP: Horris Latino, PA Pharmacy:[facility] # [id number]

## 2018-05-20 NOTE — Telephone Encounter (Signed)
Pt called and advised to call [facility] [address] in order to retreive medication that was previously sent to [facility] [address]. Pt verbalized understanding and states that his wife told him the same information as well.

## 2018-05-20 NOTE — Telephone Encounter (Signed)
Requesting refill be sent to [facility] on [address]. Requesting Call back [phone]

## 2018-06-12 ENCOUNTER — Other Ambulatory Visit: Payer: Self-pay | Admitting: Physician Assistant

## 2018-07-16 DIAGNOSIS — J410 Simple chronic bronchitis: Secondary | ICD-10-CM | POA: Diagnosis not present

## 2018-07-16 DIAGNOSIS — F17200 Nicotine dependence, unspecified, uncomplicated: Secondary | ICD-10-CM | POA: Diagnosis not present

## 2018-07-16 DIAGNOSIS — Z1211 Encounter for screening for malignant neoplasm of colon: Secondary | ICD-10-CM | POA: Diagnosis not present

## 2018-07-16 DIAGNOSIS — I10 Essential (primary) hypertension: Secondary | ICD-10-CM | POA: Diagnosis not present

## 2018-08-11 DIAGNOSIS — M150 Primary generalized (osteo)arthritis: Secondary | ICD-10-CM | POA: Diagnosis not present

## 2018-08-11 DIAGNOSIS — R945 Abnormal results of liver function studies: Secondary | ICD-10-CM | POA: Diagnosis not present

## 2018-08-11 DIAGNOSIS — M1A09X1 Idiopathic chronic gout, multiple sites, with tophus (tophi): Secondary | ICD-10-CM | POA: Diagnosis not present

## 2018-08-11 DIAGNOSIS — M0600 Rheumatoid arthritis without rheumatoid factor, unspecified site: Secondary | ICD-10-CM | POA: Diagnosis not present

## 2018-09-18 DIAGNOSIS — Z1211 Encounter for screening for malignant neoplasm of colon: Secondary | ICD-10-CM | POA: Diagnosis not present

## 2018-09-25 DIAGNOSIS — M109 Gout, unspecified: Secondary | ICD-10-CM | POA: Diagnosis not present

## 2018-09-25 DIAGNOSIS — F17200 Nicotine dependence, unspecified, uncomplicated: Secondary | ICD-10-CM | POA: Diagnosis not present

## 2018-09-25 DIAGNOSIS — Z23 Encounter for immunization: Secondary | ICD-10-CM | POA: Diagnosis not present

## 2018-09-25 DIAGNOSIS — I10 Essential (primary) hypertension: Secondary | ICD-10-CM | POA: Diagnosis not present

## 2018-11-10 DIAGNOSIS — M150 Primary generalized (osteo)arthritis: Secondary | ICD-10-CM | POA: Diagnosis not present

## 2018-11-10 DIAGNOSIS — M0600 Rheumatoid arthritis without rheumatoid factor, unspecified site: Secondary | ICD-10-CM | POA: Diagnosis not present

## 2018-11-10 DIAGNOSIS — M1A09X1 Idiopathic chronic gout, multiple sites, with tophus (tophi): Secondary | ICD-10-CM | POA: Diagnosis not present

## 2018-11-10 DIAGNOSIS — R945 Abnormal results of liver function studies: Secondary | ICD-10-CM | POA: Diagnosis not present

## 2019-06-10 DIAGNOSIS — J410 Simple chronic bronchitis: Secondary | ICD-10-CM | POA: Diagnosis not present

## 2019-06-10 DIAGNOSIS — F17200 Nicotine dependence, unspecified, uncomplicated: Secondary | ICD-10-CM | POA: Diagnosis not present

## 2019-06-10 DIAGNOSIS — K5790 Diverticulosis of intestine, part unspecified, without perforation or abscess without bleeding: Secondary | ICD-10-CM | POA: Diagnosis not present

## 2019-06-10 DIAGNOSIS — I10 Essential (primary) hypertension: Secondary | ICD-10-CM | POA: Diagnosis not present
